# Patient Record
Sex: Male | Born: 1965 | Marital: Married | Smoking: Never smoker
Health system: Southern US, Community
[De-identification: ages and names within clinical notes are randomized; demographics above are authoritative.]

## PROBLEM LIST (undated history)

## (undated) DIAGNOSIS — E119 Type 2 diabetes mellitus without complications: Secondary | ICD-10-CM

## (undated) HISTORY — DX: Type 2 diabetes mellitus without complications: E11.9

---

## 2013-05-02 ENCOUNTER — Ambulatory Visit: Payer: Self-pay | Admitting: Family Medicine

## 2013-05-24 ENCOUNTER — Ambulatory Visit: Payer: Self-pay

## 2013-06-01 ENCOUNTER — Ambulatory Visit: Payer: Self-pay

## 2013-10-10 IMAGING — CT CT ABDOMEN AND PELVIS WITHOUT AND WITH CONTRAST
2 of 4 series · 14 of 32 positions shown, 19 images · non-contrast
Comparison: none

REASON FOR EXAM: HEMATURIA  DIABETIC NO MEDS
COMMENTS:

[Series 2: without · axial · non-contrast · 0.62mm/px · z∈[-950,-623]mm · 8 of 141 slices shown, 13 images]
[im 16/141  soft-tissue]
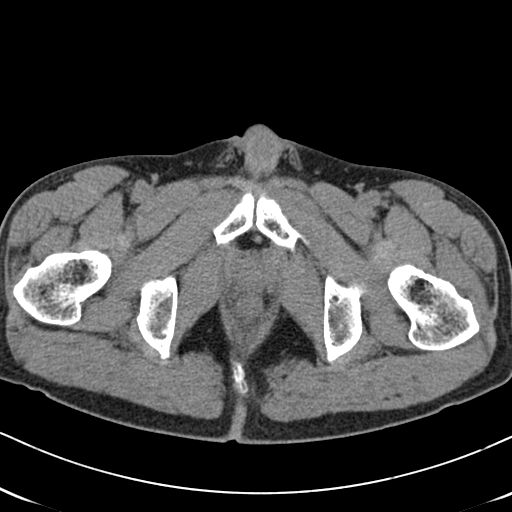
[im 16/141  bone]
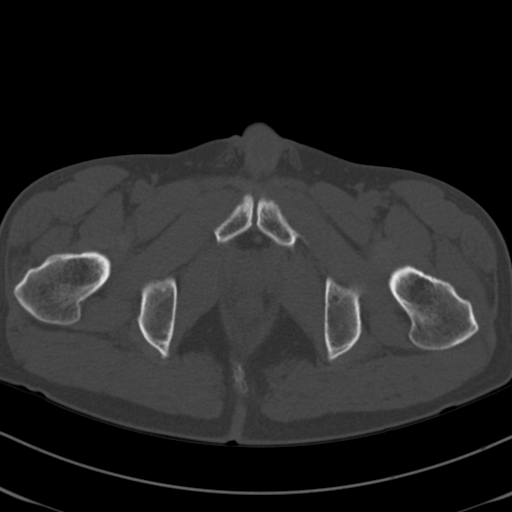
[im 32/141  soft-tissue]
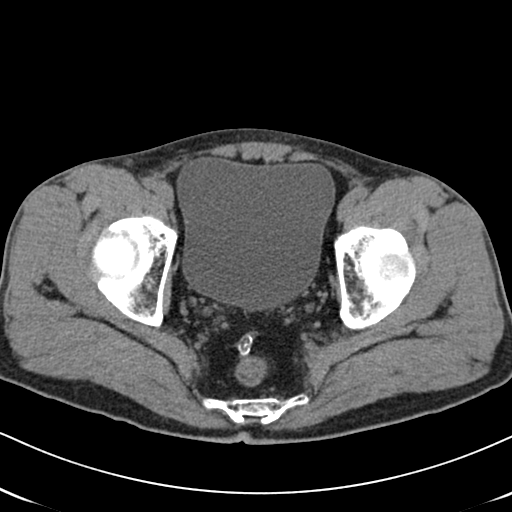
[im 47/141  soft-tissue]
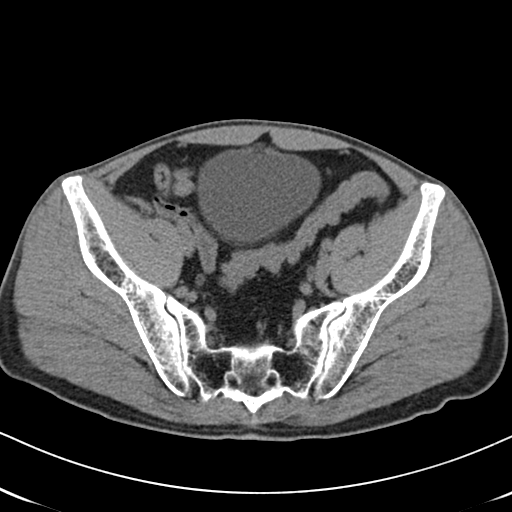
[im 63/141  soft-tissue]
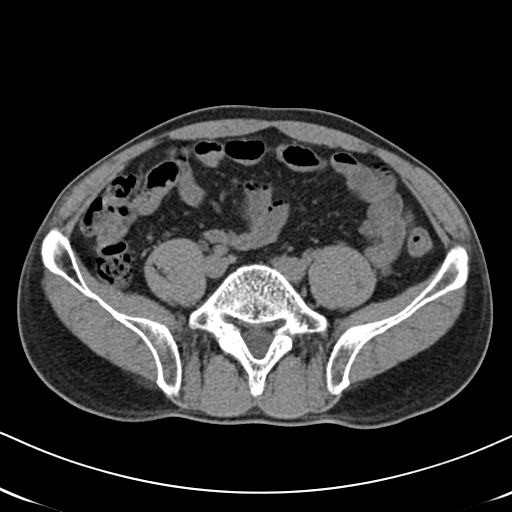
[im 78/141  soft-tissue]
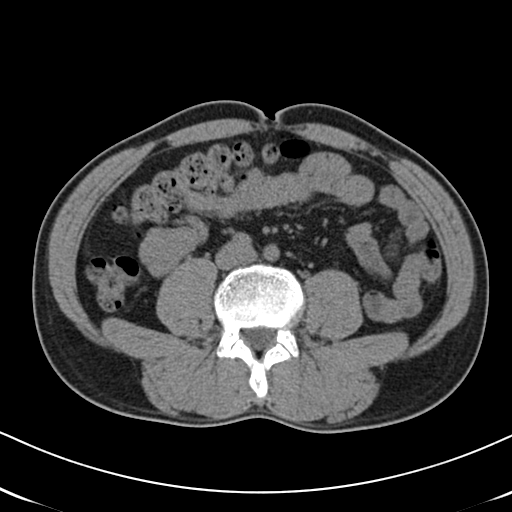
[im 78/141  lung]
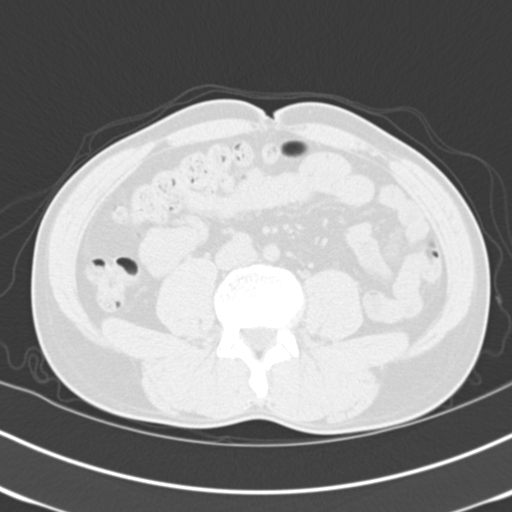
[im 94/141  soft-tissue]
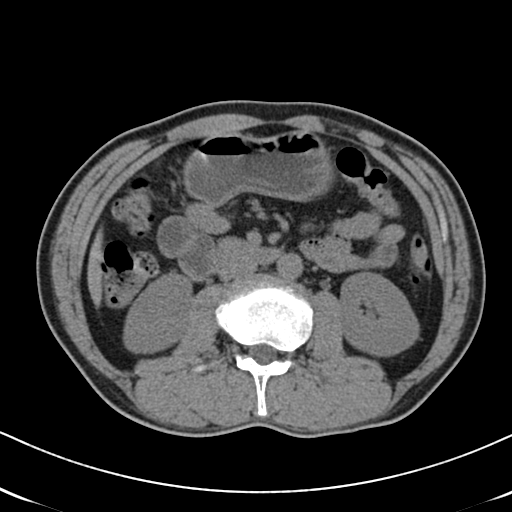
[im 94/141  lung]
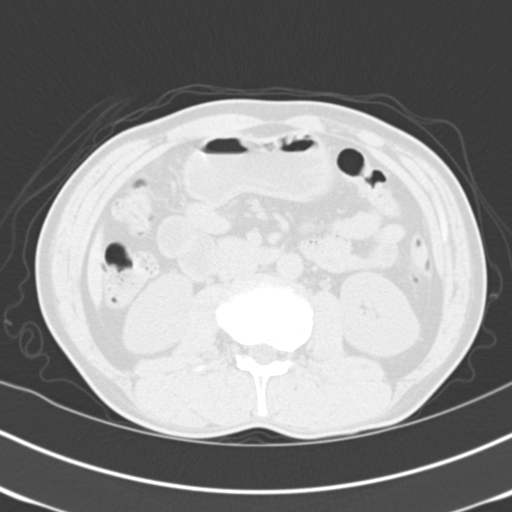
[im 109/141  soft-tissue]
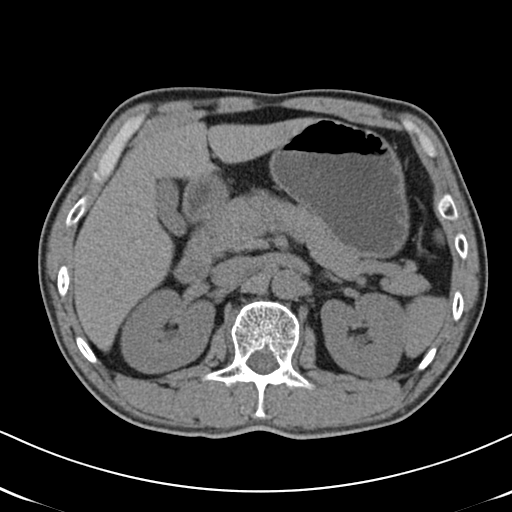
[im 109/141  lung]
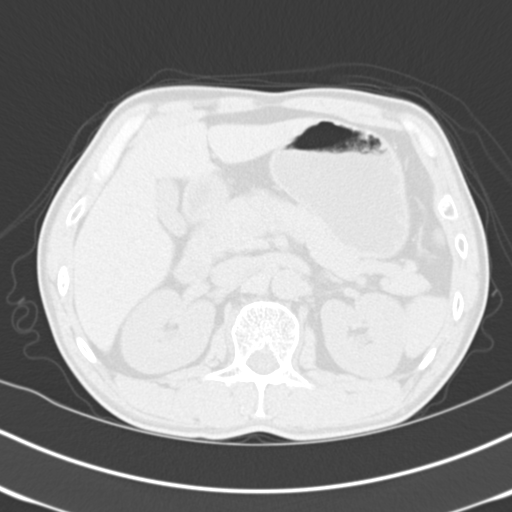
[im 125/141  soft-tissue]
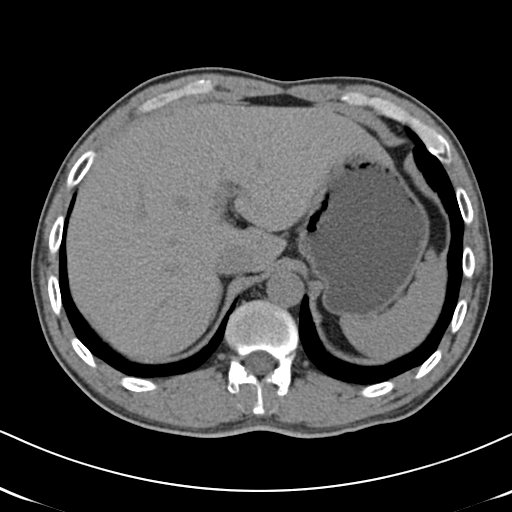
[im 125/141  lung]
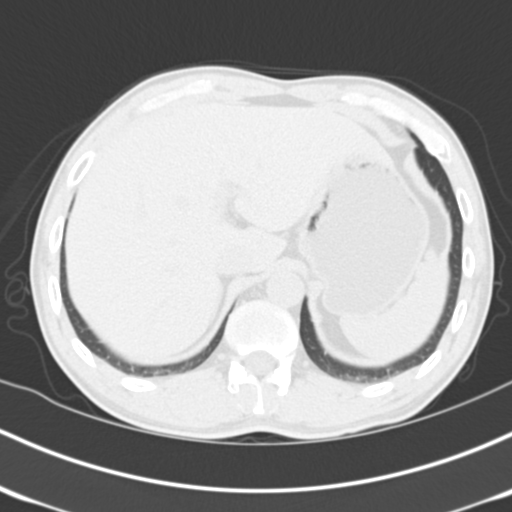

[Series 4: with · axial · 0.62mm/px · z∈[-950,-716]mm · 6 of 141 slices shown]
[im 16/141  soft-tissue]
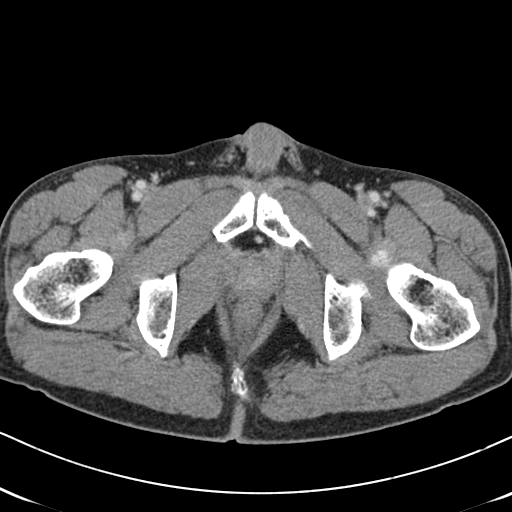
[im 32/141  soft-tissue]
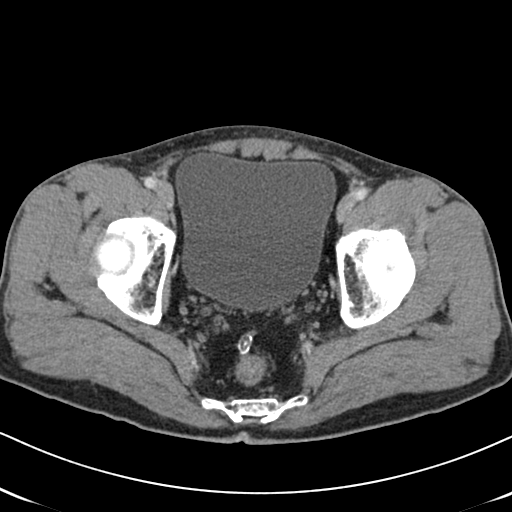
[im 47/141  soft-tissue]
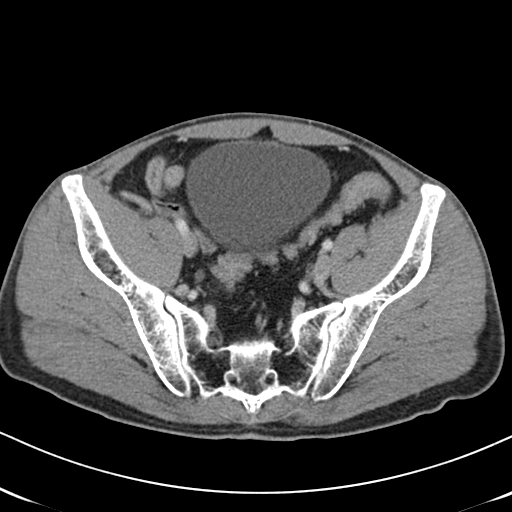
[im 63/141  soft-tissue]
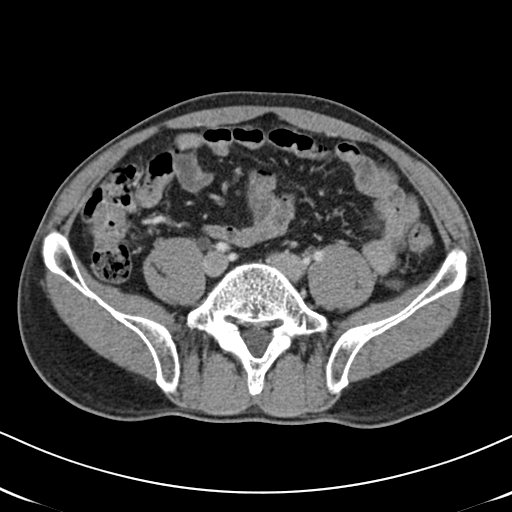
[im 78/141  soft-tissue]
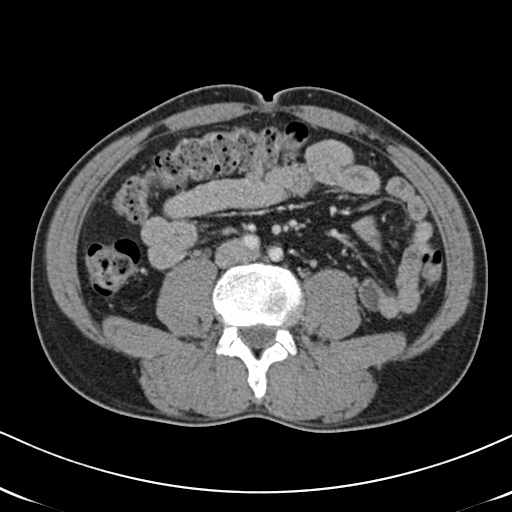
[im 94/141  soft-tissue]
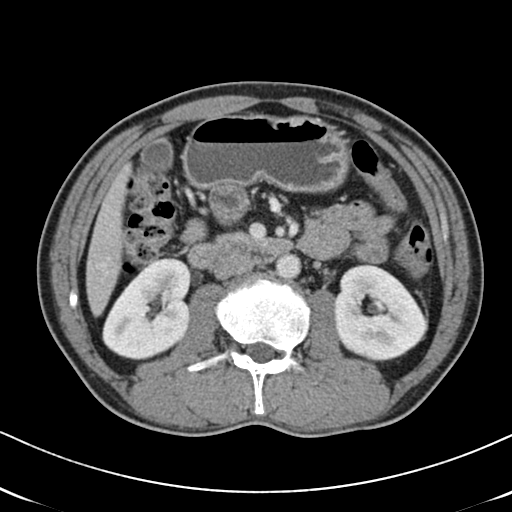

[14 of 32 positions shown; findings below may reference images not displayed]

PROCEDURE:     CT  - CT ABDOMEN / PELVIS  W/WO  - June 01, 2013  [DATE]

RESULT:     Triphasic CT of the abdomen and pelvis is reconstructed at 3 mm
slice thickness in the axial plane. The patient has no previous exam for
comparison.

Images through the base the lungs demonstrate minimal dependent atelectasis
with normal aeration otherwise. No pleural or pericardial effusion is
evident. No radiopaque gallstones are evident. There is no abnormal
calcification within the spleen or liver. There is no atherosclerotic
calcification. No nephrolithiasis, ureterolithiasis or bladder calculi are
demonstrated. A normal appearing appendix is present.

Following intravenous administration of 100 mL of Isovue-KU1 iodinated
intravenous contrast there is normal opacification of the kidneys. The aorta
is normal in caliber without dissection. The liver and spleen show no focal
mass or enlargement. The aorta shows normal opacification. The bony
structures are within normal limits. The post contrast delayed images
obtained in excretory phase show excretion of contrast opacified urine by
both kidneys with no filling defect in the bladder. There is a moderately
large amount urine in the bladder. No abnormal bowel distention is present.
IMPRESSION: No focal abnormality within either kidney. No focal bladder
abnormality evident. Unremarkable CT of the abdomen and pelvis.

[REDACTED]

## 2015-11-27 ENCOUNTER — Encounter: Payer: Self-pay | Admitting: Family Medicine

## 2015-11-27 ENCOUNTER — Ambulatory Visit (INDEPENDENT_AMBULATORY_CARE_PROVIDER_SITE_OTHER): Payer: BLUE CROSS/BLUE SHIELD | Admitting: Family Medicine

## 2015-11-27 VITALS — BP 104/68 | HR 59 | Temp 97.8°F | Resp 18 | Ht 68.0 in | Wt 143.6 lb

## 2015-11-27 DIAGNOSIS — R739 Hyperglycemia, unspecified: Secondary | ICD-10-CM | POA: Diagnosis not present

## 2015-11-27 LAB — POCT GLYCOSYLATED HEMOGLOBIN (HGB A1C): Hemoglobin A1C: 5.8

## 2015-11-27 LAB — GLUCOSE, POCT (MANUAL RESULT ENTRY): POC GLUCOSE: 117 mg/dL — AB (ref 70–99)

## 2015-11-27 NOTE — Progress Notes (Signed)
Name: Corey SearingBao Q Bhattacharyya   MRN: [id number]    DOB: 01/19/1966   Date:11/27/2015       Progress Note  Subjective  Chief Complaint  Chief Complaint  Patient presents with  . Diabetes    Diabetes Pertinent negatives for hypoglycemia include no dizziness, headaches, nervousness/anxiousness, seizures or tremors. Pertinent negatives for diabetes include no blurred vision, no chest pain, no weakness and no weight loss.    Hyperglycemia microalbumin on him At this is not truly diabetic S  Patient presents for follow-up of diabetes which is present for over 5 years. Is currently on a regimen of diet and exercise . Patient states recently compliant with their diet and exercise. There's been no hypoglycemic episodes and there no polyuria polydipsia polyphagia. His average fasting glucoses been in the low around - with a high around - . There is no end organ disease.  Last diabetic eye exam was many years ago.   Last visit with dietitian was -. Last microalbumin was obtained today .   Past Medical History  Diagnosis Date  . Diabetes mellitus without complication [facility](HCC)     Social History  Substance Use Topics  . Smoking status: Never Smoker   . Smokeless tobacco: Not on file  . Alcohol Use: 0.0 oz/week    0 Standard drinks or equivalent per week    No current outpatient prescriptions on file.  No Known Allergies  Review of Systems  Constitutional: Negative for fever, chills and weight loss.  HENT: Negative for congestion, hearing loss, sore throat and tinnitus.   Eyes: Negative for blurred vision, double vision and redness.  Respiratory: Negative for cough, hemoptysis and shortness of breath.   Cardiovascular: Negative for chest pain, palpitations, orthopnea, claudication and leg swelling.  Gastrointestinal: Negative for heartburn, nausea, vomiting, diarrhea, constipation and blood in stool.  Genitourinary: Negative for dysuria, urgency, frequency and hematuria.  Musculoskeletal: Negative for  myalgias, back pain, joint pain, falls and neck pain.  Skin: Negative for itching.  Neurological: Negative for dizziness, tingling, tremors, focal weakness, seizures, loss of consciousness, weakness and headaches.  Endo/Heme/Allergies: Does not bruise/bleed easily.  Psychiatric/Behavioral: Negative for depression and substance abuse. The patient is not nervous/anxious and does not have insomnia.      Objective  Filed Vitals:   11/27/15 0901  BP: 104/68  Pulse: 59  Temp: 97.8 F (36.6 C)  Resp: 18  Height: 5\' 8"  (1.727 m)  Weight: 143 lb 9 oz (65.12 kg)  SpO2: 98%     Physical Exam  Constitutional: He is oriented to person, place, and time and well-developed, well-nourished, and in no distress.  HENT:  Head: Normocephalic.  Eyes: EOM are normal. Pupils are equal, round, and reactive to light.  Neck: Normal range of motion. Neck supple. No thyromegaly present.  Cardiovascular: Normal rate, regular rhythm and normal heart sounds.   No murmur heard. Pulmonary/Chest: Effort normal and breath sounds normal. No respiratory distress. He has no wheezes.  Abdominal: Soft. Bowel sounds are normal.  Musculoskeletal: Normal range of motion. He exhibits no edema.  Lymphadenopathy:    He has no cervical adenopathy.  Neurological: He is alert and oriented to person, place, and time. No cranial nerve deficit. Gait normal. Coordination normal.  Skin: Skin is warm and dry. No rash noted.  Psychiatric: Affect and judgment normal.      Assessment & Plan  1. Hyperglycemia  - POCT HgB A1C - POCT Glucose (CBG)

## 2016-05-04 ENCOUNTER — Encounter: Payer: BLUE CROSS/BLUE SHIELD | Admitting: Family Medicine

## 2016-06-29 ENCOUNTER — Encounter: Payer: Self-pay | Admitting: Family Medicine

## 2016-06-29 ENCOUNTER — Encounter: Payer: BLUE CROSS/BLUE SHIELD | Admitting: Family Medicine

## 2016-06-29 DIAGNOSIS — Z8639 Personal history of other endocrine, nutritional and metabolic disease: Secondary | ICD-10-CM | POA: Insufficient documentation

## 2016-06-29 NOTE — Progress Notes (Signed)
Name: Corey Norman   MRN: [id number]    DOB: 03/27/1966   Date:06/29/2016       Progress Note  Subjective  Chief Complaint  Chief Complaint  Patient presents with  . Annual Exam    CPE    HPI  Pt. Presents for an Annual Physical Exam.   Past Medical History:  Diagnosis Date  . Diabetes mellitus without complication (HCC)     History reviewed. No pertinent surgical history.  Family History  Problem Relation Age of Onset  . Hypertension Mother     Social History   Social History  . Marital status: Married    Spouse name: N/A  . Number of children: N/A  . Years of education: N/A   Occupational History  . Not on file.   Social History Main Topics  . Smoking status: Never Smoker  . Smokeless tobacco: Never Used  . Alcohol use 0.0 oz/week  . Drug use: No  . Sexual activity: Not on file   Other Topics Concern  . Not on file   Social History Narrative  . No narrative on file    No current outpatient prescriptions on file.  No Known Allergies   ROS    Objective  Vitals:   06/29/16 1118  BP: 104/63  Pulse: 63  Resp: 15  Temp: 98.1 F (36.7 C)  TempSrc: Oral  SpO2: 98%  Weight: 139 lb 4.8 oz (63.2 kg)  Height: 5\' 8"  (1.727 m)    Physical Exam     No results found for this or any previous visit (from the past 2160 hour(s)).   Assessment & Plan  There are no diagnoses linked to this encounter.  Jeslyn Amsler Asad A. Faylene KurtzShah [facility] 06/29/2016 11:37 AM This encounter was created in error - please disregard.

## 2016-11-16 ENCOUNTER — Encounter: Payer: Self-pay | Admitting: Family Medicine

## 2016-11-16 ENCOUNTER — Ambulatory Visit (INDEPENDENT_AMBULATORY_CARE_PROVIDER_SITE_OTHER): Payer: BLUE CROSS/BLUE SHIELD | Admitting: Family Medicine

## 2016-11-16 VITALS — BP 110/68 | HR 69 | Temp 97.2°F | Resp 16 | Ht 68.0 in | Wt 145.5 lb

## 2016-11-16 DIAGNOSIS — Z7689 Persons encountering health services in other specified circumstances: Secondary | ICD-10-CM

## 2016-11-16 DIAGNOSIS — Z8639 Personal history of other endocrine, nutritional and metabolic disease: Secondary | ICD-10-CM

## 2016-11-16 DIAGNOSIS — Z0000 Encounter for general adult medical examination without abnormal findings: Secondary | ICD-10-CM | POA: Insufficient documentation

## 2016-11-16 LAB — GLUCOSE, POCT (MANUAL RESULT ENTRY): POC GLUCOSE: 140 mg/dL — AB (ref 70–99)

## 2016-11-16 LAB — POCT GLYCOSYLATED HEMOGLOBIN (HGB A1C): HEMOGLOBIN A1C: 5.9

## 2016-11-16 NOTE — Progress Notes (Signed)
Name: Robin SearingBao Q Demos   MRN: [id number]    DOB: 09/26/1966   Date:11/16/2016       Progress Note  Subjective  Chief Complaint  Chief Complaint  Patient presents with  . Annual Exam    Diabetes  He presents for his follow-up diabetic visit. He has type 2 diabetes mellitus. The initial diagnosis of diabetes was made 10 years ago. His disease course has been stable. There are no hypoglycemic associated symptoms. Pertinent negatives for diabetes include no chest pain, no fatigue, no foot paresthesias, no polydipsia and no polyuria. Current diabetic treatment includes diet (in the past, he has been on medication for diabetes, but none at this time, last A1c was within normal range). His weight is stable. Home blood sugar record trend: pt. does not check his blood glucose. An ACE inhibitor/angiotensin II receptor blocker is not being taken.     Past Medical History:  Diagnosis Date  . Diabetes mellitus without complication (HCC)    Last A1c was normal at 5.8%, not on medication.    No past surgical history on file.  Family History  Problem Relation Age of Onset  . Hypertension Mother     Social History   Social History  . Marital status: Married    Spouse name: N/A  . Number of children: N/A  . Years of education: N/A   Occupational History  . Not on file.   Social History Main Topics  . Smoking status: Never Smoker  . Smokeless tobacco: Never Used  . Alcohol use 0.0 oz/week  . Drug use: No  . Sexual activity: Not on file   Other Topics Concern  . Not on file   Social History Narrative  . No narrative on file    No current outpatient prescriptions on file.  No Known Allergies   Review of Systems  Constitutional: Negative for fatigue.  Cardiovascular: Negative for chest pain.  Endo/Heme/Allergies: Negative for polydipsia.     Objective  Vitals:   11/16/16 1347  BP: 110/68  Pulse: 69  Resp: 16  Temp: 97.2 F (36.2 C)  TempSrc: Oral  SpO2: 98%  Weight: 145  lb 8 oz (66 kg)  Height: 5\' 8"  (1.727 m)    Physical Exam  Constitutional: He is oriented to person, place, and time and well-developed, well-nourished, and in no distress.  HENT:  Head: Normocephalic and atraumatic.  Cardiovascular: Normal rate, regular rhythm and normal heart sounds.   No murmur heard. Pulmonary/Chest: Effort normal and breath sounds normal. He has no wheezes.  Neurological: He is alert and oriented to person, place, and time.  Psychiatric: Mood, memory, affect and judgment normal.  Nursing note and vitals reviewed.     Assessment & Plan  1. History of diabetes mellitus, type II Point-of-care A1c is 5.9%, considered prediabetes, glucose is elevated at 14 0 mg/dL. No pharmacotherapy indicated at this time. - POCT HgB A1C - POCT Glucose (CBG)  2. Encounter to establish care with new doctor Reviewed previous office visit Dr.Morrisey,updated past medical, surgical and social history. Patient is on no medications. Follow-up for a complete physical exam  Rubie Ficco Asad A. Faylene KurtzShah [facility] 11/16/2016 2:42 PM

## 2016-11-19 ENCOUNTER — Ambulatory Visit (INDEPENDENT_AMBULATORY_CARE_PROVIDER_SITE_OTHER): Payer: BLUE CROSS/BLUE SHIELD | Admitting: Family Medicine

## 2016-11-19 ENCOUNTER — Encounter: Payer: Self-pay | Admitting: Family Medicine

## 2016-11-19 VITALS — BP 112/80 | HR 63 | Temp 98.7°F | Resp 16 | Ht 68.0 in | Wt 144.2 lb

## 2016-11-19 DIAGNOSIS — Z1211 Encounter for screening for malignant neoplasm of colon: Secondary | ICD-10-CM | POA: Diagnosis not present

## 2016-11-19 DIAGNOSIS — Z0000 Encounter for general adult medical examination without abnormal findings: Secondary | ICD-10-CM | POA: Diagnosis not present

## 2016-11-19 LAB — CBC WITH DIFFERENTIAL/PLATELET
BASOS ABS: 52 {cells}/uL (ref 0–200)
Basophils Relative: 1 %
Eosinophils Absolute: 52 cells/uL (ref 15–500)
Eosinophils Relative: 1 %
HCT: 48.6 % (ref 38.5–50.0)
Hemoglobin: 16.4 g/dL (ref 13.2–17.1)
Lymphocytes Relative: 39 %
Lymphs Abs: 2028 cells/uL (ref 850–3900)
MCH: 32.2 pg (ref 27.0–33.0)
MCHC: 33.7 g/dL (ref 32.0–36.0)
MCV: 95.3 fL (ref 80.0–100.0)
MONOS PCT: 8 %
MPV: 9.5 fL (ref 7.5–12.5)
Monocytes Absolute: 416 cells/uL (ref 200–950)
NEUTROS ABS: 2652 {cells}/uL (ref 1500–7800)
NEUTROS PCT: 51 %
PLATELETS: 228 10*3/uL (ref 140–400)
RBC: 5.1 MIL/uL (ref 4.20–5.80)
RDW: 13.2 % (ref 11.0–15.0)
WBC: 5.2 10*3/uL (ref 3.8–10.8)

## 2016-11-19 LAB — COMPLETE METABOLIC PANEL WITH GFR
ALT: 28 U/L (ref 9–46)
AST: 25 U/L (ref 10–35)
Albumin: 4.8 g/dL (ref 3.6–5.1)
Alkaline Phosphatase: 68 U/L (ref 40–115)
BUN: 16 mg/dL (ref 7–25)
CHLORIDE: 104 mmol/L (ref 98–110)
CO2: 28 mmol/L (ref 20–31)
Calcium: 9.7 mg/dL (ref 8.6–10.3)
Creat: 0.79 mg/dL (ref 0.70–1.33)
GFR, Est African American: >89 mL/min (ref >=60)
GLUCOSE: 132 mg/dL — AB (ref 65–99)
POTASSIUM: 4.9 mmol/L (ref 3.5–5.3)
Sodium: 141 mmol/L (ref 135–146)
Total Bilirubin: 1 mg/dL (ref 0.2–1.2)
Total Protein: 7.4 g/dL (ref 6.1–8.1)

## 2016-11-19 LAB — LIPID PANEL
CHOL/HDL RATIO: 2.6 ratio (ref <5.0)
Cholesterol: 223 mg/dL — ABNORMAL HIGH (ref <200)
HDL: 86 mg/dL (ref >40)
LDL CALC: 118 mg/dL — AB (ref <100)
Triglycerides: 97 mg/dL (ref <150)
VLDL: 19 mg/dL (ref <30)

## 2016-11-19 LAB — TSH: TSH: 2.81 m[IU]/L (ref 0.40–4.50)

## 2016-11-19 NOTE — Progress Notes (Signed)
Name: Corey Norman   MRN: [id number]    DOB: 07/03/1966   Date:11/19/2016       Progress Note  Subjective  Chief Complaint  Chief Complaint  Patient presents with  . Annual Exam    HPI  Pt. Presents for CPE, he is doing well. Never had a colonoscopy, prostate exam was 2-3 years ago.  Past Medical History:  Diagnosis Date  . Diabetes mellitus without complication (HCC)    Last A1c was normal at 5.8%, not on medication.    History reviewed. No pertinent surgical history.  Family History  Problem Relation Age of Onset  . Hypertension Mother     Social History   Social History  . Marital status: Married    Spouse name: N/A  . Number of children: N/A  . Years of education: N/A   Occupational History  . Not on file.   Social History Main Topics  . Smoking status: Never Smoker  . Smokeless tobacco: Never Used  . Alcohol use 0.6 oz/week    1 Glasses of wine per week  . Drug use: No  . Sexual activity: Yes   Other Topics Concern  . Not on file   Social History Narrative  . No narrative on file    No current outpatient prescriptions on file.  No Known Allergies   Review of Systems  Constitutional: Negative for chills, fever and malaise/fatigue.  HENT: Negative for ear pain, sinus pain and sore throat.   Eyes: Negative for blurred vision and double vision.  Respiratory: Negative for cough and shortness of breath.   Cardiovascular: Negative for chest pain and leg swelling.  Gastrointestinal: Negative for abdominal pain, blood in stool, constipation, diarrhea, nausea and vomiting.  Genitourinary: Negative for dysuria, frequency, hematuria and urgency.  Musculoskeletal: Negative for back pain, joint pain and myalgias.  Neurological: Negative for dizziness and headaches.  Psychiatric/Behavioral: Negative for depression. The patient is not nervous/anxious and does not have insomnia.      Objective  Vitals:   11/19/16 0805  BP: 112/80  Pulse: 63  Resp: 16   Temp: 98.7 F (37.1 C)  TempSrc: Oral  SpO2: 98%  Weight: 144 lb 3.2 oz (65.4 kg)  Height: 5\' 8"  (1.727 m)    Physical Exam  Constitutional: He is oriented to person, place, and time and well-developed, well-nourished, and in no distress.  HENT:  Head: Normocephalic and atraumatic.  Right Ear: Tympanic membrane and ear canal normal.  Left Ear: Tympanic membrane and ear canal normal.  Eyes: Pupils are equal, round, and reactive to light.  Neck: Normal range of motion. Neck supple.  Cardiovascular: Normal rate, regular rhythm and normal heart sounds.   No murmur heard. Pulmonary/Chest: Effort normal and breath sounds normal. No respiratory distress. He has no wheezes. He has no rhonchi.  Abdominal: Soft. Bowel sounds are normal. There is no tenderness.  Genitourinary: Prostate normal. Rectal exam shows external hemorrhoid. Prostate is not enlarged and not tender.  Musculoskeletal:       Right ankle: He exhibits no swelling.       Left ankle: He exhibits no swelling.  Neurological: He is alert and oriented to person, place, and time.  Psychiatric: Mood, memory, affect and judgment normal.  Nursing note and vitals reviewed.    Assessment & Plan  1. Annual physical exam Obtain age appropriate laboratory screenings, EKG is normal - CBC with Differential - COMPLETE METABOLIC PANEL WITH GFR - Lipid Profile - TSH - Vitamin D (  25 hydroxy) - PSA - EKG 12-Lead  2. Colon cancer screening  - Ambulatory referral to Gastroenterology  Interpreter ID#: [id number]  Hazel SamsSyed Asad A. Faylene KurtzShah [facility] 11/19/2016 3:39 PM

## 2016-11-20 LAB — VITAMIN D 25 HYDROXY (VIT D DEFICIENCY, FRACTURES): Vit D, 25-Hydroxy: 28 ng/mL — ABNORMAL LOW (ref 30–100)

## 2016-11-20 LAB — PSA: PSA: 0.8 ng/mL (ref <=4.0)

## 2016-12-01 ENCOUNTER — Telehealth: Payer: Self-pay

## 2016-12-01 MED ORDER — VITAMIN D (ERGOCALCIFEROL) 1.25 MG (50000 UNIT) PO CAPS: 50000.0000 [IU] | ORAL_CAPSULE | ORAL | 0 refills | Status: DC

## 2016-12-01 NOTE — Telephone Encounter (Signed)
Patient has been notified of lab results by letter mailed to address on file due to patient does not speak english A prescription for vitamin D3 50,000 units take 1 capsule once a week for 12 weeks has been sent to [facility] [address] per Dr. Sherryll BurgerShah

## 2017-03-03 ENCOUNTER — Other Ambulatory Visit: Payer: Self-pay | Admitting: Family Medicine

## 2017-05-24 ENCOUNTER — Ambulatory Visit: Payer: BLUE CROSS/BLUE SHIELD | Admitting: Family Medicine

## 2017-06-07 ENCOUNTER — Ambulatory Visit (INDEPENDENT_AMBULATORY_CARE_PROVIDER_SITE_OTHER): Payer: BLUE CROSS/BLUE SHIELD | Admitting: Family Medicine

## 2017-06-07 ENCOUNTER — Encounter: Payer: Self-pay | Admitting: Family Medicine

## 2017-06-07 VITALS — BP 114/75 | HR 67 | Temp 98.0°F | Resp 16 | Ht 68.0 in | Wt 142.8 lb

## 2017-06-07 DIAGNOSIS — Z8639 Personal history of other endocrine, nutritional and metabolic disease: Secondary | ICD-10-CM | POA: Insufficient documentation

## 2017-06-07 DIAGNOSIS — E7800 Pure hypercholesterolemia, unspecified: Secondary | ICD-10-CM | POA: Diagnosis not present

## 2017-06-07 DIAGNOSIS — E559 Vitamin D deficiency, unspecified: Secondary | ICD-10-CM | POA: Insufficient documentation

## 2017-06-07 DIAGNOSIS — E119 Type 2 diabetes mellitus without complications: Secondary | ICD-10-CM

## 2017-06-07 LAB — POCT GLYCOSYLATED HEMOGLOBIN (HGB A1C): Hemoglobin A1C: 6.2

## 2017-06-07 LAB — GLUCOSE, POCT (MANUAL RESULT ENTRY): POC GLUCOSE: 95 mg/dL (ref 70–99)

## 2017-06-07 NOTE — Progress Notes (Signed)
Name: Corey Norman   MRN: [id number]    DOB: 03/13/1966   Date:06/07/2017       Progress Note  Subjective  Chief Complaint  Chief Complaint  Patient presents with  . Follow-up    DM    Diabetes  He presents for his follow-up diabetic visit. He has type 2 diabetes mellitus. His disease course has been stable. There are no hypoglycemic associated symptoms. Pertinent negatives for diabetes include no fatigue, no foot paresthesias, no polydipsia and no polyuria. Pertinent negatives for diabetic complications include no CVA or heart disease. Current diabetic treatment includes diet. His weight is stable. He is following a generally healthy diet. He participates in exercise daily.  Hyperlipidemia  This is a chronic problem. The problem is uncontrolled. Recent lipid tests were reviewed and are high. Current antihyperlipidemic treatment includes diet change. There are no compliance problems.       Past Medical History:  Diagnosis Date  . Diabetes mellitus without complication (HCC)    Last A1c was normal at 5.8%, not on medication.    History reviewed. No pertinent surgical history.  Family History  Problem Relation Age of Onset  . Hypertension Mother     Social History   Social History  . Marital status: Married    Spouse name: N/A  . Number of children: N/A  . Years of education: N/A   Occupational History  . Not on file.   Social History Main Topics  . Smoking status: Never Smoker  . Smokeless tobacco: Never Used  . Alcohol use 0.6 oz/week    1 Glasses of wine per week  . Drug use: No  . Sexual activity: Yes   Other Topics Concern  . Not on file   Social History Narrative  . No narrative on file     Current Outpatient Prescriptions:  Marland Kitchen.  Vitamin D, Ergocalciferol, (DRISDOL) 50000 units CAPS capsule, Take 1 capsule (50,000 Units total) by mouth once a week. For 12 weeks (Patient not taking: Reported on 06/07/2017), Disp: 12 capsule, Rfl: 0  No Known  Allergies   Review of Systems  Constitutional: Negative for fatigue.  Endo/Heme/Allergies: Negative for polydipsia.      Objective  Vitals:   06/07/17 1520  BP: 114/75  Pulse: 67  Resp: 16  Temp: 98 F (36.7 C)  TempSrc: Oral  SpO2: 98%  Weight: 142 lb 12.8 oz (64.8 kg)  Height: 5\' 8"  (1.727 m)    Physical Exam  Constitutional: He is oriented to person, place, and time and well-developed, well-nourished, and in no distress.  HENT:  Head: Normocephalic and atraumatic.  Cardiovascular: Normal rate, regular rhythm and normal heart sounds.   No murmur heard. Pulmonary/Chest: Effort normal. He has no wheezes.  Abdominal: Soft. Bowel sounds are normal. There is no tenderness.  Musculoskeletal: Normal range of motion. He exhibits no edema.  Neurological: He is alert and oriented to person, place, and time.  Skin: Skin is warm and dry.  Psychiatric: Mood, memory, affect and judgment normal.  Nursing note and vitals reviewed.    Recent Results (from the past 2160 hour(s))  POCT Glucose (CBG)     Status: Normal   Collection Time: 06/07/17  3:24 PM  Result Value Ref Range   POC Glucose 95 70 - 99 mg/dl  POCT HgB [id number]     Status: Abnormal   Collection Time: 06/07/17  3:28 PM  Result Value Ref Range   Hemoglobin A1C 6.2  Assessment & Plan  1. Diabetes mellitus without complication (HCC)  A1c 6.2%, well-controlled diabetes, no pharmacotherapy indicated, obtain urine microalbumin- POCT HgB A1C - POCT Glucose (CBG) - Urine Microalbumin w/creat. ratio  2. Hypercholesterolemia  elevated total and LDL cholesterol, repeat today and consider starting on statin - Lipid panel  3. Vitamin D insufficiency  - VITAMIN D 25 Hydroxy (Vit-D Deficiency, Fractures)  Latavious Bitter Asad A. Faylene [facility] 06/07/2017 3:31 PM

## 2017-06-08 LAB — LIPID PANEL
Cholesterol: 158 mg/dL (ref <200)
HDL: 53 mg/dL (ref >40)
LDL Cholesterol: 72 mg/dL (ref <100)
Total CHOL/HDL Ratio: 3 Ratio (ref <5.0)
Triglycerides: 163 mg/dL — ABNORMAL HIGH (ref <150)
VLDL: 33 mg/dL — ABNORMAL HIGH (ref <30)

## 2017-06-09 LAB — MICROALBUMIN / CREATININE URINE RATIO
Creatinine, Urine: 147 mg/dL (ref 20–370)
Microalb, Ur: <0.2 mg/dL

## 2017-06-09 LAB — VITAMIN D 25 HYDROXY (VIT D DEFICIENCY, FRACTURES): VIT D 25 HYDROXY: 27 ng/mL — AB (ref 30–100)

## 2017-06-10 ENCOUNTER — Telehealth: Payer: Self-pay

## 2017-06-10 MED ORDER — VITAMIN D (ERGOCALCIFEROL) 1.25 MG (50000 UNIT) PO CAPS: 50000.0000 [IU] | ORAL_CAPSULE | ORAL | 0 refills | Status: DC

## 2017-06-10 NOTE — Telephone Encounter (Signed)
Patient has been notified of lab results and a prescription for Vitamin D3 50,000 units take 1 capsule once a week x12 weeks has been sent to [facility] [address] per Dr. Sherryll BurgerShah, patient has been notified

## 2017-09-13 ENCOUNTER — Ambulatory Visit: Payer: BLUE CROSS/BLUE SHIELD | Admitting: Family Medicine

## 2017-10-06 ENCOUNTER — Other Ambulatory Visit: Payer: Self-pay | Admitting: Family Medicine

## 2017-11-29 ENCOUNTER — Encounter: Payer: Self-pay | Admitting: Family Medicine

## 2017-11-29 ENCOUNTER — Ambulatory Visit (INDEPENDENT_AMBULATORY_CARE_PROVIDER_SITE_OTHER): Payer: BLUE CROSS/BLUE SHIELD | Admitting: Family Medicine

## 2017-11-29 VITALS — BP 110/64 | HR 67 | Temp 98.1°F | Resp 14 | Ht 68.0 in | Wt 144.1 lb

## 2017-11-29 DIAGNOSIS — K2970 Gastritis, unspecified, without bleeding: Secondary | ICD-10-CM

## 2017-11-29 DIAGNOSIS — B9681 Helicobacter pylori [H. pylori] as the cause of diseases classified elsewhere: Secondary | ICD-10-CM

## 2017-11-29 DIAGNOSIS — Z0001 Encounter for general adult medical examination with abnormal findings: Secondary | ICD-10-CM | POA: Diagnosis not present

## 2017-11-29 DIAGNOSIS — E119 Type 2 diabetes mellitus without complications: Secondary | ICD-10-CM | POA: Diagnosis not present

## 2017-11-29 DIAGNOSIS — Z0000 Encounter for general adult medical examination without abnormal findings: Secondary | ICD-10-CM

## 2017-11-29 LAB — POCT GLYCOSYLATED HEMOGLOBIN (HGB A1C): Hemoglobin A1C: 6.2

## 2017-11-29 LAB — GLUCOSE, POCT (MANUAL RESULT ENTRY): POC Glucose: 136 mg/dl — AB (ref 70–99)

## 2017-11-29 NOTE — Progress Notes (Signed)
Name: Corey Norman   MRN: [id number]    DOB: 01/30/1966   Date:11/29/2017       Progress Note  Subjective  Chief Complaint  Chief Complaint  Patient presents with  . Annual Exam    HPI  Pt. Presents for Annual Physical Exam He [place], showed adenomatous polyps and biopsy report is not available.  He is due for prostate cancer screening.    Past Medical History:  Diagnosis Date  . Diabetes mellitus without complication (HCC)    Last A1c was normal at 5.8%, not on medication.    History reviewed. No pertinent surgical history.  Family History  Problem Relation Age of Onset  . Hypertension Mother     Social History   Socioeconomic History  . Marital status: Married    Spouse name: Not on file  . Number of children: Not on file  . Years of education: Not on file  . Highest education level: Not on file  Social Needs  . Financial resource strain: Not on file  . Food insecurity - worry: Not on file  . Food insecurity - inability: Not on file  . Transportation needs - medical: Not on file  . Transportation needs - non-medical: Not on file  Occupational History  . Not on file  Tobacco Use  . Smoking status: Never Smoker  . Smokeless tobacco: Never Used  Substance and Sexual Activity  . Alcohol use: Yes    Alcohol/week: 0.6 oz    Types: 1 Glasses of wine per week  . Drug use: No  . Sexual activity: Yes  Other Topics Concern  . Not on file  Social History Narrative  . Not on file     Current Outpatient Medications:  .  cholecalciferol (VITAMIN D) 1000 units tablet, Take 1,000 Units by mouth daily., Disp: , Rfl:  .  Vitamin D, Ergocalciferol, (DRISDOL) 50000 units CAPS capsule, Take 1 capsule (50,000 Units total) by mouth once a week. For 12 weeks (Patient not taking: Reported on 11/29/2017), Disp: 12 capsule, Rfl: 0  No Known Allergies   Review of Systems  Constitutional: Negative for chills, fever, malaise/fatigue and weight  loss.  HENT: Negative for congestion, ear pain and sinus pain.   Eyes: Negative for blurred vision and double vision.  Respiratory: Negative for cough, hemoptysis, sputum production and stridor.   Cardiovascular: Negative for chest pain, palpitations and claudication.  Gastrointestinal: Negative for abdominal pain, blood in stool, nausea and vomiting.  Genitourinary: Negative for dysuria and hematuria.  Musculoskeletal: Negative for back pain and neck pain.  Skin: Negative for itching and rash.  Neurological: Negative for dizziness and headaches.  Psychiatric/Behavioral: Negative for depression. The patient is not nervous/anxious and does not have insomnia.      Objective  Vitals:   11/29/17 1502  BP: 110/64  Pulse: 67  Resp: 14  Temp: 98.1 F (36.7 C)  TempSrc: Oral  SpO2: 99%  Weight: 144 lb 1.6 oz (65.4 kg)  Height: 5\' 8"  (1.727 m)    Physical Exam  Constitutional: He is oriented to person, place, and time and well-developed, well-nourished, and in no distress.  HENT:  Head: Normocephalic and atraumatic.  Mouth/Throat: Uvula is midline and oropharynx is clear and moist.  Moderate amount of cerumen in the ear canals  Cardiovascular: Normal rate, regular rhythm, S1 normal, S2 normal and normal heart sounds.  No murmur heard. Pulmonary/Chest: Effort normal and breath sounds normal. No respiratory distress.  He has no wheezes. He has no rhonchi.  Abdominal: Soft. Bowel sounds are normal. There is no tenderness.  Genitourinary: Prostate normal. Rectal exam shows external hemorrhoid. Rectal exam shows no tenderness.  Neurological: He is alert and oriented to person, place, and time. GCS score is 15.  Psychiatric: Mood, memory, affect and judgment normal.  Nursing note and vitals reviewed.      Assessment & Plan  1. Annual physical exam Obtain age-appropriate laboratory screenings - CBC with Differential/Platelet - COMPLETE METABOLIC PANEL WITH GFR - Lipid panel -  VITAMIN D 25 Hydroxy (Vit-D Deficiency, Fractures) - TSH - PSA  2. Diabetes mellitus without complication (HCC) Point-of-care A1c 6.2%, well-controlled diabetes - POCT HgB A1C - POCT Glucose (CBG)  3. Helicobacter pylori gastritis Patient has apparently been treated for H. pylori gastritis by the gastroenterologist in [place], we will order an H. pylori breath test to confirm eradication as recommended by the gastroenterologist - H. pylori breath test  Hazel Sams A. Faylene [facility] 11/29/2017 3:12 PM

## 2017-12-08 LAB — COMPREHENSIVE METABOLIC PANEL
ALK PHOS: 80 IU/L (ref 39–117)
ALT: 24 IU/L (ref 0–44)
AST: 19 IU/L (ref 0–40)
Albumin/Globulin Ratio: 1.8 (ref 1.2–2.2)
Albumin: 4.6 g/dL (ref 3.5–5.5)
BUN/Creatinine Ratio: 22 — ABNORMAL HIGH (ref 9–20)
BUN: 16 mg/dL (ref 6–24)
Bilirubin Total: 0.6 mg/dL (ref 0.0–1.2)
CO2: 23 mmol/L (ref 20–29)
CREATININE: 0.74 mg/dL — AB (ref 0.76–1.27)
Calcium: 9.3 mg/dL (ref 8.7–10.2)
Chloride: 104 mmol/L (ref 96–106)
GFR calc Af Amer: 123 mL/min/{1.73_m2} (ref >59)
GFR calc non Af Amer: 106 mL/min/{1.73_m2} (ref >59)
Globulin, Total: 2.6 g/dL (ref 1.5–4.5)
Glucose: 117 mg/dL — ABNORMAL HIGH (ref 65–99)
Potassium: 4.1 mmol/L (ref 3.5–5.2)
SODIUM: 144 mmol/L (ref 134–144)
Total Protein: 7.2 g/dL (ref 6.0–8.5)

## 2017-12-08 LAB — CBC WITH DIFFERENTIAL/PLATELET
Basophils Absolute: 0 10*3/uL (ref 0.0–0.2)
Basos: 1 %
EOS (ABSOLUTE): 0.1 10*3/uL (ref 0.0–0.4)
Eos: 2 %
Hematocrit: 47.1 % (ref 37.5–51.0)
Hemoglobin: 15.4 g/dL (ref 13.0–17.7)
Immature Grans (Abs): 0 10*3/uL (ref 0.0–0.1)
Immature Granulocytes: 0 %
LYMPHS ABS: 2 10*3/uL (ref 0.7–3.1)
Lymphs: 49 %
MCH: 30.9 pg (ref 26.6–33.0)
MCHC: 32.7 g/dL (ref 31.5–35.7)
MCV: 94 fL (ref 79–97)
MONOCYTES: 7 %
Monocytes Absolute: 0.3 10*3/uL (ref 0.1–0.9)
Neutrophils Absolute: 1.6 10*3/uL (ref 1.4–7.0)
Neutrophils: 41 %
Platelets: 218 10*3/uL (ref 150–379)
RBC: 4.99 x10E6/uL (ref 4.14–5.80)
RDW: 13.9 % (ref 12.3–15.4)
WBC: 3.9 10*3/uL (ref 3.4–10.8)

## 2017-12-08 LAB — LIPID PANEL
CHOL/HDL RATIO: 2.5 ratio (ref 0.0–5.0)
CHOLESTEROL TOTAL: 181 mg/dL (ref 100–199)
HDL: 72 mg/dL (ref >39)
LDL Calculated: 90 mg/dL (ref 0–99)
TRIGLYCERIDES: 94 mg/dL (ref 0–149)
VLDL Cholesterol Cal: 19 mg/dL (ref 5–40)

## 2017-12-08 LAB — H. PYLORI BREATH TEST: H pylori Breath Test: NEGATIVE

## 2017-12-08 LAB — TSH: TSH: 1.77 u[IU]/mL (ref 0.450–4.500)

## 2017-12-08 LAB — VITAMIN D 25 HYDROXY (VIT D DEFICIENCY, FRACTURES): VIT D 25 HYDROXY: 33.9 ng/mL (ref 30.0–100.0)

## 2017-12-08 LAB — PSA: Prostate Specific Ag, Serum: 0.8 ng/mL (ref 0.0–4.0)

## 2018-03-07 ENCOUNTER — Ambulatory Visit (INDEPENDENT_AMBULATORY_CARE_PROVIDER_SITE_OTHER): Payer: BLUE CROSS/BLUE SHIELD | Admitting: Family Medicine

## 2018-03-07 ENCOUNTER — Encounter: Payer: Self-pay | Admitting: Family Medicine

## 2018-03-07 VITALS — BP 120/70 | HR 64 | Temp 98.0°F | Resp 16 | Ht 68.0 in | Wt 139.2 lb

## 2018-03-07 DIAGNOSIS — Z8639 Personal history of other endocrine, nutritional and metabolic disease: Secondary | ICD-10-CM | POA: Diagnosis not present

## 2018-03-07 DIAGNOSIS — E559 Vitamin D deficiency, unspecified: Secondary | ICD-10-CM

## 2018-03-07 DIAGNOSIS — B351 Tinea unguium: Secondary | ICD-10-CM | POA: Diagnosis not present

## 2018-03-07 DIAGNOSIS — E7800 Pure hypercholesterolemia, unspecified: Secondary | ICD-10-CM

## 2018-03-07 MED ORDER — CYANOCOBALAMIN 2000 MCG PO TABS: 2000.0000 ug | ORAL_TABLET | ORAL | Status: DC

## 2018-03-07 NOTE — Patient Instructions (Addendum)
You can try tolnaftate on the rough scaly skin on your feet We'll see you in July for a visit and then try to get you on track for seeing you twice a year

## 2018-03-07 NOTE — Assessment & Plan Note (Addendum)
Check labs in July

## 2018-03-07 NOTE — Progress Notes (Signed)
BP 120/70   Pulse 64   Temp 98 F (36.7 C) (Oral)   Resp 16   Ht 5\' 8"  (1.727 m)   Wt 139 lb 3.2 oz (63.1 kg)   SpO2 98%   BMI 21.17 kg/m    Subjective:    Patient ID: Corey Norman, male    DOB: Nov 03, 1966, 52 y.o.   MRN: [id number]  HPI: [name] is a 52 y.o. male  Chief Complaint  Patient presents with  . Follow-up    BS    HPI Patient is here for follow-up; he speaks Mandarin and we are using the language line for interpretation services Patient is new to me; previous provider left our practice  He has type 2 diabetes mellitus; controlled with diet alone; patient declines pneumonia vaccine when discussed; 10 years ago was over the level, but has controlled with strict diet since then; vision is good; no dry mouth; no eye exam recently, recommended but patient declined  Urine microalbumin:Cr UTD, done in July 2018 Lab Results  Component Value Date   HGBA1C 6.2 11/29/2017   He has high cholesterol Lab Results  Component Value Date   CHOL 181 12/06/2017   HDL 72 12/06/2017   LDLCALC 90 12/06/2017   TRIG 94 12/06/2017   CHOLHDL 2.5 12/06/2017    He has vitamin D deficiency; has 2000 iu now from the pharmacy; finished the 50k pills already; will check in July  He has a colonoscopy in [place]; was treated for H pylori gastritis; recheck breath test here was negative, Jan 2019  Depression screen [facility] 2/9 03/07/2018 11/29/2017 06/07/2017 06/29/2016  Decreased Interest 0 0 0 0  Down, Depressed, Hopeless 0 0 0 0  PHQ - 2 Score 0 0 0 0    Relevant past medical, surgical, family and social history reviewed Past Medical History:  Diagnosis Date  . Diabetes mellitus without complication (HCC)    Last A1c was normal at 5.8%, not on medication.   History reviewed. No pertinent surgical history. Family History  Problem Relation Age of Onset  . Hypertension Mother    Social History   Tobacco Use  . Smoking status: Never Smoker  . Smokeless tobacco: Never Used    Substance Use Topics  . Alcohol use: Yes    Alcohol/week: 0.6 oz    Types: 1 Glasses of wine per week  . Drug use: No    Interim medical history since last visit reviewed. Allergies and medications reviewed  Review of Systems Per HPI unless specifically indicated above     Objective:    BP 120/70   Pulse 64   Temp 98 F (36.7 C) (Oral)   Resp 16   Ht 5\' 8"  (1.727 m)   Wt 139 lb 3.2 oz (63.1 kg)   SpO2 98%   BMI 21.17 kg/m   Wt Readings from Last 3 Encounters:  03/07/18 139 lb 3.2 oz (63.1 kg)  11/29/17 144 lb 1.6 oz (65.4 kg)  06/07/17 142 lb 12.8 oz (64.8 kg)    Physical Exam Diabetic Foot Form - Detailed   Diabetic Foot Exam - detailed Diabetic Foot exam was performed with the following findings:  Yes 03/07/2018  7:30 AM  Visual Foot Exam completed.:  Yes  Pulse Foot Exam completed.:  Yes  Right Dorsalis Pedis:  Present Left Dorsalis Pedis:  Present  Sensory Foot Exam Completed.:  Yes Semmes-Weinstein Monofilament Test       Results for orders placed or  performed in visit on 11/29/17  CBC with Differential/Platelet  Result Value Ref Range   WBC 3.9 3.4 - 10.8 x10E3/uL   RBC 4.99 4.14 - 5.80 x10E6/uL   Hemoglobin 15.4 13.0 - 17.7 g/dL   Hematocrit 96.047.1 45.437.5 - 51.0 %   MCV 94 79 - 97 fL   MCH 30.9 26.6 - 33.0 pg   MCHC 32.7 31.5 - 35.7 g/dL   RDW 09.813.9 11.912.3 - 14.715.4 %   Platelets 218 150 - 379 x10E3/uL   Neutrophils 41 Not Estab. %   Lymphs 49 Not Estab. %   Monocytes 7 Not Estab. %   Eos 2 Not Estab. %   Basos 1 Not Estab. %   Neutrophils Absolute 1.6 1.4 - 7.0 x10E3/uL   Lymphocytes Absolute 2.0 0.7 - 3.1 x10E3/uL   Monocytes Absolute 0.3 0.1 - 0.9 x10E3/uL   EOS (ABSOLUTE) 0.1 0.0 - 0.4 x10E3/uL   Basophils Absolute 0.0 0.0 - 0.2 x10E3/uL   Immature Granulocytes 0 Not Estab. %   Immature Grans (Abs) 0.0 0.0 - 0.1 x10E3/uL  Comprehensive Metabolic Panel (CMET)  Result Value Ref Range   Glucose 117 (H) 65 - 99 mg/dL   BUN 16 6 - 24 mg/dL    Creatinine, Ser 8.290.74 (L) 0.76 - 1.27 mg/dL   GFR calc non Af Amer 106 >59 mL/min/1.73   GFR calc Af Amer 123 >59 mL/min/1.73   BUN/Creatinine Ratio 22 (H) 9 - 20   Sodium 144 134 - 144 mmol/L   Potassium 4.1 3.5 - 5.2 mmol/L   Chloride 104 96 - 106 mmol/L   CO2 23 20 - 29 mmol/L   Calcium 9.3 8.7 - 10.2 mg/dL   Total Protein 7.2 6.0 - 8.5 g/dL   Albumin 4.6 3.5 - 5.5 g/dL   Globulin, Total 2.6 1.5 - 4.5 g/dL   Albumin/Globulin Ratio 1.8 1.2 - 2.2   Bilirubin Total 0.6 0.0 - 1.2 mg/dL   Alkaline Phosphatase 80 39 - 117 IU/L   AST 19 0 - 40 IU/L   ALT 24 0 - 44 IU/L  Vitamin D (25 hydroxy)  Result Value Ref Range   Vit D, 25-Hydroxy 33.9 30.0 - 100.0 ng/mL  TSH  Result Value Ref Range   TSH 1.770 0.450 - 4.500 uIU/mL  PSA  Result Value Ref Range   Prostate Specific Ag, Serum 0.8 0.0 - 4.0 ng/mL  Lipid panel  Result Value Ref Range   Cholesterol, Total 181 100 - 199 mg/dL   Triglycerides 94 0 - 149 mg/dL   HDL 72 >56>39 mg/dL   VLDL Cholesterol Cal 19 5 - 40 mg/dL   LDL Calculated 90 0 - 99 mg/dL   Chol/HDL Ratio 2.5 0.0 - 5.0 ratio  H. pylori breath test  Result Value Ref Range   H pylori Breath Test Negative Negative  POCT HgB A1C  Result Value Ref Range   Hemoglobin A1C 6.2   POCT Glucose (CBG)  Result Value Ref Range   POC Glucose 136 (A) 70 - 99 mg/dl      Assessment & Plan:   Problem List Items Addressed This Visit      Other   Vitamin D insufficiency    Check labs in July      Hypercholesterolemia - Primary    Fair control without any medicines; patient does not want medicines      Hx of diabetes mellitus    Patient does not believe he has diabetes; indeed, the last few  years, he has been controlled with diet alone and does not believe he has DM at all; does not want meds; does not want intervention or aggressive therapy; will check A1c every 6 months; discussed eye exam, aspirin, statin, renal protection, and he does not want meds       Other Visit  Diagnoses    Onychomycosis       noted; patient has hx of DM, but not active, no meds for years       Follow up plan: Return in about 3 months (around 06/12/2018) for twenty minute follow-up with fasting labs, then every 6 months; 40 min.  An after-visit summary was printed and given to the patient at check-out.  Please see the patient instructions which may contain other information and recommendations beyond what is mentioned above in the assessment and plan.  Meds ordered this encounter  Medications  . cyanocobalamin ([facility] VITAMIN B12) 2000 MCG tablet    Sig: Take 1 tablet (2,000 mcg total) by mouth every other day.    No orders of the defined types were placed in this encounter.

## 2018-03-07 NOTE — Assessment & Plan Note (Addendum)
Fair control without any medicines; patient does not want medicines

## 2018-03-23 NOTE — Assessment & Plan Note (Signed)
Patient does not believe he has diabetes; indeed, the last few years, he has been controlled with diet alone and does not believe he has DM at all; does not want meds; does not want intervention or aggressive therapy; will check A1c every 6 months; discussed eye exam, aspirin, statin, renal protection, and he does not want meds

## 2018-06-13 ENCOUNTER — Ambulatory Visit: Payer: BLUE CROSS/BLUE SHIELD | Admitting: Family Medicine

## 2018-06-27 ENCOUNTER — Encounter: Payer: Self-pay | Admitting: Family Medicine

## 2018-06-27 ENCOUNTER — Ambulatory Visit: Payer: BLUE CROSS/BLUE SHIELD | Admitting: Family Medicine

## 2018-06-27 VITALS — BP 130/78 | HR 64 | Temp 98.0°F | Resp 12 | Ht 68.0 in | Wt 140.3 lb

## 2018-06-27 DIAGNOSIS — E119 Type 2 diabetes mellitus without complications: Secondary | ICD-10-CM

## 2018-06-27 DIAGNOSIS — E7800 Pure hypercholesterolemia, unspecified: Secondary | ICD-10-CM

## 2018-06-27 DIAGNOSIS — E559 Vitamin D deficiency, unspecified: Secondary | ICD-10-CM

## 2018-06-27 DIAGNOSIS — Z8639 Personal history of other endocrine, nutritional and metabolic disease: Secondary | ICD-10-CM

## 2018-06-27 DIAGNOSIS — R8290 Unspecified abnormal findings in urine: Secondary | ICD-10-CM

## 2018-06-27 DIAGNOSIS — B353 Tinea pedis: Secondary | ICD-10-CM

## 2018-06-27 NOTE — Assessment & Plan Note (Signed)
Check labs today.

## 2018-06-27 NOTE — Progress Notes (Signed)
BP 130/78   Pulse 64   Temp 98 F (36.7 C) (Oral)   Resp 12   Ht 5\' 8"  (1.727 m)   Wt 140 lb 4.8 oz (63.6 kg)   SpO2 95%   BMI 21.33 kg/m    Subjective:    Patient ID: Corey Norman, male    DOB: Feb 21, 1966, 52 y.o.   MRN: [id number]  HPI: Corey Norman is a 52 y.o. male  Chief Complaint  Patient presents with  . Follow-up    HPI Patient is here for f/u Type 2 diabetes No problems with feet No dry mouth or blurred vision Not checking sugars in a long time (no home FSBS) He declines eye exam and pneumonia vaccine  High cholesterol; not taking any medicine at this time  Vitamin D deficiency in the past; last 3 vitamin D levels reviewed, and most recent was back to normal  Alcohol intake; he drinks every day; [employer], just 1 or 2 bottles a day   [facility] Visit from 06/27/2018 in [facility]  AUDIT-C Score  8     He says that he had an abnormal urine in the past; he does not see any blood in his urine; he denies dysuria; he just remembers that something in his urine was "high"  Depression screen [facility] 2/9 06/27/2018 03/07/2018 11/29/2017 06/07/2017 06/29/2016  Decreased Interest 0 0 0 0 0  Down, Depressed, Hopeless 0 0 0 0 0  PHQ - 2 Score 0 0 0 0 0    Relevant past medical, surgical, family and social history reviewed Past Medical History:  Diagnosis Date  . Diabetes mellitus without complication (HCC)    Last A1c was normal at 5.8%, not on medication.   History reviewed. No pertinent surgical history. Family History  Problem Relation Age of Onset  . Hypertension Mother    Social History   Tobacco Use  . Smoking status: Never Smoker  . Smokeless tobacco: Never Used  Substance Use Topics  . Alcohol use: Yes    Alcohol/week: 0.0 oz    Comment: Patient states 1-2 bottles a night  . Drug use: No    Interim medical history since last visit reviewed. Allergies and medications reviewed  Review of Systems Per HPI unless specifically indicated above    Objective:    BP 130/78   Pulse 64   Temp 98 F (36.7 C) (Oral)   Resp 12   Ht 5\' 8"  (1.727 m)   Wt 140 lb 4.8 oz (63.6 kg)   SpO2 95%   BMI 21.33 kg/m   Wt Readings from Last 3 Encounters:  06/27/18 140 lb 4.8 oz (63.6 kg)  03/07/18 139 lb 3.2 oz (63.1 kg)  11/29/17 144 lb 1.6 oz (65.4 kg)    Physical Exam  Constitutional: He appears well-developed and well-nourished. No distress.  Eyes: No scleral icterus.  Cardiovascular: Normal rate and regular rhythm.  Pulmonary/Chest: Effort normal and breath sounds normal.  Neurological: He is alert.  Skin: No pallor.  Psychiatric: He has a normal mood and affect.   Results for orders placed or performed in visit on 11/29/17  CBC with Differential/Platelet  Result Value Ref Range   WBC 3.9 3.4 - 10.8 x10E3/uL   RBC 4.99 4.14 - 5.80 x10E6/uL   Hemoglobin 15.4 13.0 - 17.7 g/dL   Hematocrit 40.9 81.1 - 51.0 %   MCV 94 79 - 97 fL   MCH 30.9 26.6 - 33.0 pg   MCHC  32.7 31.5 - 35.7 g/dL   RDW 04.513.9 40.912.3 - 81.115.4 %   Platelets 218 150 - 379 x10E3/uL   Neutrophils 41 Not Estab. %   Lymphs 49 Not Estab. %   Monocytes 7 Not Estab. %   Eos 2 Not Estab. %   Basos 1 Not Estab. %   Neutrophils Absolute 1.6 1.4 - 7.0 x10E3/uL   Lymphocytes Absolute 2.0 0.7 - 3.1 x10E3/uL   Monocytes Absolute 0.3 0.1 - 0.9 x10E3/uL   EOS (ABSOLUTE) 0.1 0.0 - 0.4 x10E3/uL   Basophils Absolute 0.0 0.0 - 0.2 x10E3/uL   Immature Granulocytes 0 Not Estab. %   Immature Grans (Abs) 0.0 0.0 - 0.1 x10E3/uL  Comprehensive Metabolic Panel (CMET)  Result Value Ref Range   Glucose 117 (H) 65 - 99 mg/dL   BUN 16 6 - 24 mg/dL   Creatinine, Ser 9.140.74 (L) 0.76 - 1.27 mg/dL   GFR calc non Af Amer 106 >59 mL/min/1.73   GFR calc Af Amer 123 >59 mL/min/1.73   BUN/Creatinine Ratio 22 (H) 9 - 20   Sodium 144 134 - 144 mmol/L   Potassium 4.1 3.5 - 5.2 mmol/L   Chloride 104 96 - 106 mmol/L   CO2 23 20 - 29 mmol/L   Calcium 9.3 8.7 - 10.2 mg/dL   Total Protein 7.2 6.0 - 8.5  g/dL   Albumin 4.6 3.5 - 5.5 g/dL   Globulin, Total 2.6 1.5 - 4.5 g/dL   Albumin/Globulin Ratio 1.8 1.2 - 2.2   Bilirubin Total 0.6 0.0 - 1.2 mg/dL   Alkaline Phosphatase 80 39 - 117 IU/L   AST 19 0 - 40 IU/L   ALT 24 0 - 44 IU/L  Vitamin D (25 hydroxy)  Result Value Ref Range   Vit D, 25-Hydroxy 33.9 30.0 - 100.0 ng/mL  TSH  Result Value Ref Range   TSH 1.770 0.450 - 4.500 uIU/mL  PSA  Result Value Ref Range   Prostate Specific Ag, Serum 0.8 0.0 - 4.0 ng/mL  Lipid panel  Result Value Ref Range   Cholesterol, Total 181 100 - 199 mg/dL   Triglycerides 94 0 - 149 mg/dL   HDL 72 >78>39 mg/dL   VLDL Cholesterol Cal 19 5 - 40 mg/dL   LDL Calculated 90 0 - 99 mg/dL   Chol/HDL Ratio 2.5 0.0 - 5.0 ratio  H. pylori breath test  Result Value Ref Range   H pylori Breath Test Negative Negative  POCT HgB A1C  Result Value Ref Range   Hemoglobin A1C 6.2   POCT Glucose (CBG)  Result Value Ref Range   POC Glucose 136 (A) 70 - 99 mg/dl      Assessment & Plan:   Problem List Items Addressed This Visit      Musculoskeletal and Integument   Athlete's foot    Try tolnaftate, written on AVS and high-lighted so he can find that at the pharmacy        Other   Vitamin D insufficiency    Last vit D back to normal      Hypercholesterolemia    Check labs today      Relevant Orders   Lipid panel   Hx of diabetes mellitus    Check A1c and glucose today; foot exam by MD; patient does not want to have eye exam or pneumonia vaccine       Other Visit Diagnoses    Diabetes mellitus without complication (HCC)    -  Primary  Relevant Orders   Microalbumin / creatinine urine ratio   Lipid panel   Hemoglobin A1c   COMPLETE METABOLIC PANEL WITH GFR   Abnormal urine       patient reports something "high" in his urine years ago; will check urine dip and micro today   Relevant Orders   Urinalysis w microscopic + reflex cultur       Follow up plan: Return in about 6 months (around  12/28/2018) for follow-up visit with Dr. Sherie Don.  An after-visit summary was printed and given to the patient at check-out.  Please see the patient instructions which may contain other information and recommendations beyond what is mentioned above in the assessment and plan.  No orders of the defined types were placed in this encounter.   Orders Placed This Encounter  Procedures  . Microalbumin / creatinine urine ratio  . Lipid panel  . Hemoglobin A1c  . COMPLETE METABOLIC PANEL WITH GFR  . Urinalysis w microscopic + reflex cultur

## 2018-06-27 NOTE — Assessment & Plan Note (Addendum)
Last vit D back to normal

## 2018-06-27 NOTE — Patient Instructions (Signed)
Medicine for feet:  Tolnaftate  We will get labs today If you have not heard anything from my staff in a week about any orders/referrals/studies from today, please contact us here to follow-up [phone]

## 2018-06-27 NOTE — Assessment & Plan Note (Addendum)
Check A1c and glucose today; foot exam by MD; patient does not want to have eye exam or pneumonia vaccine

## 2018-06-27 NOTE — Assessment & Plan Note (Addendum)
Try tolnaftate, written on AVS and high-lighted so he can find that at the pharmacy

## 2018-06-28 ENCOUNTER — Other Ambulatory Visit: Payer: Self-pay | Admitting: Family Medicine

## 2018-06-28 LAB — LIPID PANEL
Cholesterol: 207 mg/dL — ABNORMAL HIGH (ref <200)
HDL: 71 mg/dL (ref >40)
LDL Cholesterol (Calc): 110 mg/dL (calc) — ABNORMAL HIGH
Non-HDL Cholesterol (Calc): 136 mg/dL (calc) — ABNORMAL HIGH (ref <130)
TRIGLYCERIDES: 146 mg/dL (ref <150)
Total CHOL/HDL Ratio: 2.9 (calc) (ref <5.0)

## 2018-06-28 LAB — COMPLETE METABOLIC PANEL WITH GFR
AG RATIO: 1.8 (calc) (ref 1.0–2.5)
ALBUMIN MSPROF: 4.7 g/dL (ref 3.6–5.1)
ALT: 32 U/L (ref 9–46)
AST: 27 U/L (ref 10–35)
Alkaline phosphatase (APISO): 66 U/L (ref 40–115)
BILIRUBIN TOTAL: 1.1 mg/dL (ref 0.2–1.2)
BUN: 16 mg/dL (ref 7–25)
CHLORIDE: 104 mmol/L (ref 98–110)
CO2: 28 mmol/L (ref 20–32)
Calcium: 9.6 mg/dL (ref 8.6–10.3)
Creat: 0.89 mg/dL (ref 0.70–1.33)
GFR, EST AFRICAN AMERICAN: 114 mL/min/{1.73_m2} (ref >=60)
GFR, Est Non African American: 98 mL/min/{1.73_m2} (ref >=60)
Globulin: 2.6 g/dL (calc) (ref 1.9–3.7)
Glucose, Bld: 119 mg/dL — ABNORMAL HIGH (ref 65–99)
POTASSIUM: 4.7 mmol/L (ref 3.5–5.3)
Sodium: 140 mmol/L (ref 135–146)
TOTAL PROTEIN: 7.3 g/dL (ref 6.1–8.1)

## 2018-06-28 LAB — URINALYSIS W MICROSCOPIC + REFLEX CULTURE
BACTERIA UA: NONE SEEN /HPF
Bilirubin Urine: NEGATIVE
Glucose, UA: NEGATIVE
HYALINE CAST: NONE SEEN /LPF
Hgb urine dipstick: NEGATIVE
Ketones, ur: NEGATIVE
LEUKOCYTE ESTERASE: NEGATIVE
Nitrites, Initial: NEGATIVE
PH: 6 (ref 5.0–8.0)
Protein, ur: NEGATIVE
RBC / HPF: NONE SEEN /HPF (ref 0–2)
Specific Gravity, Urine: 1.023 (ref 1.001–1.03)
Squamous Epithelial / LPF: NONE SEEN /HPF (ref <=5)
WBC, UA: NONE SEEN /HPF (ref 0–5)

## 2018-06-28 LAB — HEMOGLOBIN A1C
Hgb A1c MFr Bld: 6.2 % of total Hgb — ABNORMAL HIGH (ref <5.7)
Mean Plasma Glucose: 131 (calc)
eAG (mmol/L): 7.3 (calc)

## 2018-06-28 LAB — NO CULTURE INDICATED

## 2018-06-28 LAB — MICROALBUMIN / CREATININE URINE RATIO
Creatinine, Urine: 189 mg/dL (ref 20–320)
MICROALB UR: 0.9 mg/dL
MICROALB/CREAT RATIO: 5 ug/mg{creat} (ref <30)

## 2018-06-28 MED ORDER — METFORMIN HCL ER 500 MG PO TB24: 500.0000 mg | ORAL_TABLET | Freq: Every day | ORAL | 5 refills | Status: DC

## 2018-06-28 MED ORDER — ATORVASTATIN CALCIUM 10 MG PO TABS: 10.0000 mg | ORAL_TABLET | Freq: Every day | ORAL | 5 refills | Status: DC

## 2018-06-28 NOTE — Progress Notes (Signed)
Start statin and metformin

## 2018-07-25 ENCOUNTER — Ambulatory Visit: Payer: Self-pay
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
## 2018-07-31 ENCOUNTER — Ambulatory Visit: Payer: Self-pay | Admitting: Family Medicine

## 2018-08-29 ENCOUNTER — Other Ambulatory Visit: Payer: Self-pay

## 2018-08-29 DIAGNOSIS — Z1211 Encounter for screening for malignant neoplasm of colon: Secondary | ICD-10-CM

## 2018-09-13 ENCOUNTER — Encounter: Payer: Self-pay | Admitting: *Deleted

## 2019-01-02 ENCOUNTER — Ambulatory Visit (INDEPENDENT_AMBULATORY_CARE_PROVIDER_SITE_OTHER): Payer: BLUE CROSS/BLUE SHIELD | Admitting: Family Medicine

## 2019-01-02 ENCOUNTER — Encounter: Payer: Self-pay | Admitting: Family Medicine

## 2019-01-02 VITALS — BP 126/74 | HR 63 | Temp 97.9°F | Resp 12 | Ht 68.0 in | Wt 151.4 lb

## 2019-01-02 DIAGNOSIS — B353 Tinea pedis: Secondary | ICD-10-CM | POA: Diagnosis not present

## 2019-01-02 DIAGNOSIS — E7800 Pure hypercholesterolemia, unspecified: Secondary | ICD-10-CM | POA: Diagnosis not present

## 2019-01-02 DIAGNOSIS — Z5181 Encounter for therapeutic drug level monitoring: Secondary | ICD-10-CM | POA: Diagnosis not present

## 2019-01-02 DIAGNOSIS — Z8639 Personal history of other endocrine, nutritional and metabolic disease: Secondary | ICD-10-CM

## 2019-01-02 NOTE — Assessment & Plan Note (Signed)
Patient is not taking any medicine for this, does not want to unless he absolutely has to; will check A1c today; foot exam by MD today; he does not sound like he is going to limit white rice, staple in his diet; he is avoiding sugary drinks, though

## 2019-01-02 NOTE — Assessment & Plan Note (Addendum)
Check labs today; we discussed the risks of high LDL, can lead to heart attack or stroke; low saturated fats encouraged; he says he would like to see what the labs show and then he might consider a medicine

## 2019-01-02 NOTE — Progress Notes (Signed)
BP 126/74   Pulse 63   Temp 97.9 F (36.6 C) (Oral)   Resp 12   Ht 5\' 8"  (1.727 m)   Wt 151 lb 6.4 oz (68.7 kg)   SpO2 99%   BMI 23.02 kg/m    Subjective:    Patient ID: Corey SearingBao Q Kwiecinski, male    DOB: 11/26/1965, 53 y.o.   MRN: [id number]  HPI: Corey Norman is a [age] y.o. male  Chief Complaint  Patient presents with  . Follow-up    HPI Here for f/u; Congohinese (Mandarin) interpreter phone service utilized through visit Last labs 6 months ago Hx of diabetes; no dry mouth; no blurry vision He never started the metformin; he does not want to take medicine; no one in the family has diabetes; no sweet drinks or soda; rice is white rice  High cholesterol; LDL 110; he is not taking the cholesterol medicine; he never started it he says; he doesn't want to take medicine  Rough skin on the feet; no other problems with feet   [facility] 01/02/2019 06/27/2018 03/07/2018 11/29/2017 06/07/2017  Decreased Interest 0 0 0 0 0  Down, Depressed, Hopeless 0 0 0 0 0  PHQ - 2 Score 0 0 0 0 0  Altered sleeping 0 - - - -  Tired, decreased energy 0 - - - -  Change in appetite 0 - - - -  Feeling bad or failure about yourself  0 - - - -  Trouble concentrating 0 - - - -  Moving slowly or fidgety/restless 0 - - - -  Suicidal thoughts 0 - - - -  PHQ-9 Score 0 - - - -  Difficult doing work/chores Not difficult at all - - - -   Fall Risk  01/02/2019 06/27/2018 03/07/2018 11/29/2017 06/07/2017  Falls in the past year? 0 No No No No  Number falls in past yr: 0 - - - -  Injury with Fall? 0 - - - -    Relevant past medical, surgical, family and social history reviewed Past Medical History:  Diagnosis Date  . Diabetes mellitus without complication (HCC)    Last A1c was normal at 5.8%, not on medication.   History reviewed. No pertinent surgical history.   Family History  Problem Relation Age of Onset  . Hypertension Mother    Social History   Tobacco Use  . Smoking status: Never Smoker  . Smokeless  tobacco: Never Used  Substance Use Topics  . Alcohol use: Yes    Alcohol/week: 0.0 standard drinks    Comment: wine per night  . Drug use: No     [facility]  AUDIT-C Score  4    MD note: no smoking, drink just a little bit; little bit of red wine  Interim medical history since last visit reviewed. Allergies and medications reviewed  Review of Systems  Respiratory: Negative for shortness of breath.   Cardiovascular: Negative for chest pain.  Gastrointestinal: Negative for blood in stool.  Genitourinary: Negative for hematuria.   Per HPI unless specifically indicated above     Objective:    BP 126/74   Pulse 63   Temp 97.9 F (36.6 C) (Oral)   Resp 12   Ht 5\' 8"  (1.727 m)   Wt 151 lb 6.4 oz (68.7 kg)   SpO2 99%   BMI 23.02 kg/m   Wt Readings from Last 3 Encounters:  01/02/19 151  lb 6.4 oz (68.7 kg)  06/27/18 140 lb 4.8 oz (63.6 kg)  03/07/18 139 lb 3.2 oz (63.1 kg)    Physical Exam Constitutional:      General: He is not in acute distress.    Appearance: He is well-developed.  HENT:     Head: Normocephalic and atraumatic.  Eyes:     General: No scleral icterus. Neck:     Thyroid: No thyromegaly.  Cardiovascular:     Rate and Rhythm: Normal rate and regular rhythm.  Pulmonary:     Effort: Pulmonary effort is normal.     Breath sounds: Normal breath sounds.  Abdominal:     General: Bowel sounds are normal. There is no distension.     Palpations: Abdomen is soft.     Tenderness: There is no abdominal tenderness.  Skin:    General: Skin is warm and dry.     Coloration: Skin is not pale.  Neurological:     Mental Status: He is alert.     Coordination: Coordination normal.  Psychiatric:        Behavior: Behavior normal.        Thought Content: Thought content normal.        Judgment: Judgment normal.    Diabetic Foot Form - Detailed   Diabetic Foot Exam - detailed Diabetic Foot exam was performed  with the following findings:  Yes 01/02/2019 10:52 AM  Visual Foot Exam completed.:  Yes  Pulse Foot Exam completed.:  Yes  Right Dorsalis Pedis:  Present Left Dorsalis Pedis:  Present  Sensory Foot Exam Completed.:  Yes Semmes-Weinstein Monofilament Test R Site 1-Great Toe:  Pos L Site 1-Great Toe:  Pos    Comments:  Dry scaly skin on the soles of the feet     Results for orders placed or performed in visit on 06/27/18  Urinalysis w microscopic + reflex cultur  Result Value Ref Range   Color, Urine YELLOW YELLOW   APPearance CLEAR CLEAR   Specific Gravity, Urine 1.023 1.001 - 1.03   pH 6.0 5.0 - 8.0   Glucose, UA NEGATIVE NEGATIVE   Bilirubin Urine NEGATIVE NEGATIVE   Ketones, ur NEGATIVE NEGATIVE   Hgb urine dipstick NEGATIVE NEGATIVE   Protein, ur NEGATIVE NEGATIVE   Nitrites, Initial NEGATIVE NEGATIVE   Leukocyte Esterase NEGATIVE NEGATIVE   WBC, UA NONE SEEN 0 - 5 /HPF   RBC / HPF NONE SEEN 0 - 2 /HPF   Squamous Epithelial / LPF NONE SEEN < OR = 5 /HPF   Bacteria, UA NONE SEEN NONE SEEN /HPF   Hyaline Cast NONE SEEN NONE SEEN /LPF  Microalbumin / creatinine urine ratio  Result Value Ref Range   Creatinine, Urine 189 20 - 320 mg/dL   Microalb, Ur 0.9 mg/dL   Microalb Creat Ratio 5 <30 mcg/mg creat  Lipid panel  Result Value Ref Range   Cholesterol 207 (H) <200 mg/dL   HDL 71 >40>40 mg/dL   Triglycerides 981146 <191<150 mg/dL   LDL Cholesterol (Calc) 110 (H) mg/dL (calc)   Total CHOL/HDL Ratio 2.9 <5.0 (calc)   Non-HDL Cholesterol (Calc) 136 (H) <130 mg/dL (calc)  COMPLETE METABOLIC PANEL WITH GFR  Result Value Ref Range   Glucose, Bld 119 (H) 65 - 99 mg/dL   BUN 16 7 - 25 mg/dL   Creat 4.780.89 2.950.70 - 6.211.33 mg/dL   GFR, Est Non African American 98 > OR = 60 mL/min/1.4573m2   GFR, Est African American 114 > OR =  60 mL/min/1.47m2   BUN/Creatinine Ratio NOT APPLICABLE 6 - 22 (calc)   Sodium 140 135 - 146 mmol/L   Potassium 4.7 3.5 - 5.3 mmol/L   Chloride 104 98 - 110 mmol/L    CO2 28 20 - 32 mmol/L   Calcium 9.6 8.6 - 10.3 mg/dL   Total Protein 7.3 6.1 - 8.1 g/dL   Albumin 4.7 3.6 - 5.1 g/dL   Globulin 2.6 1.9 - 3.7 g/dL (calc)   AG Ratio 1.8 1.0 - 2.5 (calc)   Total Bilirubin 1.1 0.2 - 1.2 mg/dL   Alkaline phosphatase (APISO) 66 40 - 115 U/L   AST 27 10 - 35 U/L   ALT 32 9 - 46 U/L  Hemoglobin A1c  Result Value Ref Range   Hgb A1c MFr Bld 6.2 (H) <5.7 % of total Hgb   Mean Plasma Glucose 131 (calc)   eAG (mmol/L) 7.3 (calc)  REFLEXIVE URINE CULTURE  Result Value Ref Range   Reflexve Urine Culture NO CULTURE INDICATED       Assessment & Plan:   Problem List Items Addressed This Visit      Musculoskeletal and Integument   Athlete's foot    Try tolnaftate        Other   Hypercholesterolemia    Check labs today; we discussed the risks of high LDL, can lead to heart attack or stroke; low saturated fats encouraged; he says he would like to see what the labs show and then he might consider a medicine      Relevant Orders   Lipid panel   Hx of diabetes mellitus - Primary    Patient is not taking any medicine for this, does not want to unless he absolutely has to; will check A1c today; foot exam by MD today; he does not sound like he is going to limit white rice, staple in his diet; he is avoiding sugary drinks, though      Relevant Orders   Hemoglobin A1C    Other Visit Diagnoses    Medication monitoring encounter       Relevant Orders   COMPLETE METABOLIC PANEL WITH GFR       Follow up plan: Return in about 6 months (around 07/03/2019) for follow-up visit with Dr. Sherie Don.  An after-visit summary was printed and given to the patient at check-out.  Please see the patient instructions which may contain other information and recommendations beyond what is mentioned above in the assessment and plan.  No orders of the defined types were placed in this encounter.   Orders Placed This Encounter  Procedures  . Hemoglobin A1C  . Lipid panel  .  COMPLETE METABOLIC PANEL WITH GFR

## 2019-01-02 NOTE — Assessment & Plan Note (Signed)
Try tolnaftate

## 2019-01-02 NOTE — Patient Instructions (Addendum)
Try tolnaftate cream for the feet, apply once a day  We'll try to get more hand-outs in Mandarin for you

## 2019-01-03 ENCOUNTER — Other Ambulatory Visit: Payer: Self-pay | Admitting: Family Medicine

## 2019-01-03 LAB — HEMOGLOBIN A1C
HEMOGLOBIN A1C: 6.4 %{Hb} — AB (ref <5.7)
Mean Plasma Glucose: 137 (calc)
eAG (mmol/L): 7.6 (calc)

## 2019-01-03 LAB — COMPLETE METABOLIC PANEL WITH GFR
AG Ratio: 2 (calc) (ref 1.0–2.5)
ALBUMIN MSPROF: 4.5 g/dL (ref 3.6–5.1)
ALT: 26 U/L (ref 9–46)
AST: 19 U/L (ref 10–35)
Alkaline phosphatase (APISO): 68 U/L (ref 35–144)
BUN: 17 mg/dL (ref 7–25)
CALCIUM: 9.1 mg/dL (ref 8.6–10.3)
CO2: 27 mmol/L (ref 20–32)
CREATININE: 0.75 mg/dL (ref 0.70–1.33)
Chloride: 107 mmol/L (ref 98–110)
GFR, EST AFRICAN AMERICAN: 121 mL/min/{1.73_m2} (ref >=60)
GFR, EST NON AFRICAN AMERICAN: 105 mL/min/{1.73_m2} (ref >=60)
GLOBULIN: 2.3 g/dL (ref 1.9–3.7)
Glucose, Bld: 125 mg/dL — ABNORMAL HIGH (ref 65–99)
Potassium: 4.2 mmol/L (ref 3.5–5.3)
SODIUM: 141 mmol/L (ref 135–146)
TOTAL PROTEIN: 6.8 g/dL (ref 6.1–8.1)
Total Bilirubin: 0.4 mg/dL (ref 0.2–1.2)

## 2019-01-03 LAB — LIPID PANEL
CHOL/HDL RATIO: 4.2 (calc) (ref <5.0)
CHOLESTEROL: 216 mg/dL — AB (ref <200)
HDL: 52 mg/dL (ref >=40)
Non-HDL Cholesterol (Calc): 164 mg/dL (calc) — ABNORMAL HIGH (ref <130)
Triglycerides: 503 mg/dL — ABNORMAL HIGH (ref <150)

## 2019-01-03 MED ORDER — ATORVASTATIN CALCIUM 10 MG PO TABS: 10.0000 mg | ORAL_TABLET | Freq: Every day | ORAL | 1 refills | Status: DC

## 2019-01-03 MED ORDER — OMEGA-3-ACID ETHYL ESTERS 1 G PO CAPS
2.0000 g | ORAL_CAPSULE | Freq: Two times a day (BID) | ORAL | 11 refills | Status: DC
Start: 2019-01-03 — End: 2019-02-13

## 2019-01-03 NOTE — Progress Notes (Signed)
Lipid lowering med recommended Recheck labs in 6 weeks, CMA to order

## 2019-01-03 NOTE — Progress Notes (Signed)
Corey Norman, please let the patient know that his 3 month blood sugar has gone up, but still controlled; if he does not want medicine, then just really watch diet and try to walk and get exercise; if not already exercising, start low and build up slowly His triglycerides are over 500, so he really needs cholesterol medicine; we talked at his visit about plaque build up which can lead to heart attacks and strokes, so I will encourage him to take the cholesterol medicine Recheck fasting lipids in 6 weeks (please ORDER) Thank you

## 2019-01-23 ENCOUNTER — Telehealth: Payer: Self-pay | Admitting: Family Medicine

## 2019-01-23 DIAGNOSIS — K2970 Gastritis, unspecified, without bleeding: Principal | ICD-10-CM

## 2019-01-23 DIAGNOSIS — B9681 Helicobacter pylori [H. pylori] as the cause of diseases classified elsewhere: Secondary | ICD-10-CM

## 2019-01-23 NOTE — Telephone Encounter (Signed)
Toniann Fail stated that her dad would like a referral to see a GI specialist if possible.  He wants to check to see if his previous infection in his intestine from last year when he was seen in [place] has gotten better.

## 2019-01-23 NOTE — Telephone Encounter (Signed)
What would be dx?

## 2019-01-23 NOTE — Telephone Encounter (Signed)
That's fine to order

## 2019-01-23 NOTE — Telephone Encounter (Signed)
I don't know It wasn't in the first note You'll have to ask them (H pylori or colitis or something else? I don't know)

## 2019-01-29 ENCOUNTER — Encounter: Payer: Self-pay | Admitting: *Deleted

## 2019-02-06 ENCOUNTER — Ambulatory Visit: Payer: Self-pay | Admitting: Gastroenterology

## 2019-02-12 ENCOUNTER — Other Ambulatory Visit: Payer: Self-pay

## 2019-02-12 DIAGNOSIS — E7800 Pure hypercholesterolemia, unspecified: Secondary | ICD-10-CM

## 2019-02-13 ENCOUNTER — Other Ambulatory Visit: Payer: Self-pay | Admitting: Family Medicine

## 2019-02-13 DIAGNOSIS — E7800 Pure hypercholesterolemia, unspecified: Secondary | ICD-10-CM

## 2019-02-13 DIAGNOSIS — E781 Pure hyperglyceridemia: Secondary | ICD-10-CM

## 2019-02-13 LAB — LIPID PANEL
CHOL/HDL RATIO: 3.7 (calc) (ref <5.0)
Cholesterol: 231 mg/dL — ABNORMAL HIGH (ref <200)
HDL: 63 mg/dL (ref >=40)
Non-HDL Cholesterol (Calc): 168 mg/dL (calc) — ABNORMAL HIGH (ref <130)
Triglycerides: 463 mg/dL — ABNORMAL HIGH (ref <150)

## 2019-02-13 MED ORDER — ICOSAPENT ETHYL 1 G PO CAPS: 2.0000 g | ORAL_CAPSULE | Freq: Two times a day (BID) | ORAL | 0 refills | Status: DC

## 2019-02-13 MED ORDER — ATORVASTATIN CALCIUM 20 MG PO TABS: 20.0000 mg | ORAL_TABLET | Freq: Every day | ORAL | 0 refills | Status: DC

## 2019-02-22 ENCOUNTER — Ambulatory Visit: Payer: Self-pay | Admitting: Gastroenterology

## 2019-02-27 ENCOUNTER — Ambulatory Visit: Payer: Self-pay | Admitting: Gastroenterology

## 2019-04-10 ENCOUNTER — Ambulatory Visit: Payer: Self-pay | Admitting: Gastroenterology

## 2019-05-15 ENCOUNTER — Telehealth: Payer: Self-pay | Admitting: Family Medicine

## 2019-05-15 NOTE — Telephone Encounter (Signed)
-----   Message from Hubbard Hartshorn, [place] sent at 02/13/2019  8:46 AM EDT ----- Regarding: Please call patient to come in for fasting lipid recheck Please call patient to come in for fasting lipid recheck.

## 2019-05-15 NOTE — Telephone Encounter (Signed)
Left message on voicemail.

## 2019-06-05 ENCOUNTER — Ambulatory Visit: Payer: Self-pay | Admitting: Gastroenterology

## 2019-07-03 ENCOUNTER — Ambulatory Visit: Payer: BLUE CROSS/BLUE SHIELD | Admitting: Family Medicine

## 2019-07-17 ENCOUNTER — Encounter: Payer: Self-pay | Admitting: Nurse Practitioner

## 2019-07-17 ENCOUNTER — Ambulatory Visit (INDEPENDENT_AMBULATORY_CARE_PROVIDER_SITE_OTHER): Payer: BLUE CROSS/BLUE SHIELD | Admitting: Nurse Practitioner

## 2019-07-17 ENCOUNTER — Other Ambulatory Visit: Payer: Self-pay

## 2019-07-17 VITALS — BP 136/76 | HR 64 | Temp 96.9°F | Resp 14 | Ht 65.0 in | Wt 148.1 lb

## 2019-07-17 DIAGNOSIS — E781 Pure hyperglyceridemia: Secondary | ICD-10-CM | POA: Diagnosis not present

## 2019-07-17 DIAGNOSIS — E119 Type 2 diabetes mellitus without complications: Secondary | ICD-10-CM | POA: Diagnosis not present

## 2019-07-17 DIAGNOSIS — E7800 Pure hypercholesterolemia, unspecified: Secondary | ICD-10-CM

## 2019-07-17 DIAGNOSIS — Z5181 Encounter for therapeutic drug level monitoring: Secondary | ICD-10-CM | POA: Diagnosis not present

## 2019-07-17 MED ORDER — ICOSAPENT ETHYL 1 G PO CAPS: 1.0000 g | ORAL_CAPSULE | Freq: Two times a day (BID) | ORAL | 1 refills | Status: DC

## 2019-07-17 MED ORDER — ATORVASTATIN CALCIUM 20 MG PO TABS: 20.0000 mg | ORAL_TABLET | Freq: Every day | ORAL | 1 refills | Status: DC

## 2019-07-17 NOTE — Progress Notes (Signed)
Name: Corey Norman   MRN: [id number]    DOB: 12/04/1965   Date:07/17/2019       Progress Note  Subjective  Chief Complaint  Chief Complaint  Patient presents with  . Follow-up  . Medication Refill    HPI   Hyperlipidemia Patient rx  Lipitor 20mg  daily, vascepa 2g BID. He is taking lipitor daily, vascepa he is doing 1 tablet in the morning and 1 tablet in the evening  Diet: eats a lot vegetables and rice, eats smaller portions of meat. A few days a week.  Denies myalgias Lab Results  Component Value Date   CHOL 231 (H) 02/12/2019   HDL 63 02/12/2019   LDLCALC  02/12/2019     Comment:     . LDL cholesterol not calculated. Triglyceride levels greater than 400 mg/dL invalidate calculated LDL results. . Reference range: <100 . Desirable range <100 mg/dL for primary prevention;   <70 mg/dL for patients with CHD or diabetic patients  with > or = 2 CHD risk factors. Marland Kitchen. LDL-C is now calculated using the Martin-Hopkins  calculation, which is a validated novel method providing  better accuracy than the Friedewald equation in the  estimation of LDL-C.  Horald PollenMartin SS et al. Lenox AhrJAMA. 4098;119(142013;310(19): 2061-2068  (http://education.[facility].com/faq/FAQ164)    TRIG 463 (H) 02/12/2019   CHOLHDL 3.7 02/12/2019    Diabetes Diet controlled; last 4 A1C's seen in prediabetic range.  Is on statin, not on ACEi  Has never been to eye doctor  Denies polyphagia, polydipsia, polyphagia Lab Results  Component Value Date   HGBA1C 6.4 (H) 01/02/2019       PHQ2/9: [facility] 07/17/2019 01/02/2019 06/27/2018 03/07/2018 11/29/2017  Decreased Interest 0 0 0 0 0  Down, Depressed, Hopeless 0 0 0 0 0  PHQ - 2 Score 0 0 0 0 0  Altered sleeping 0 0 - - -  Tired, decreased energy 0 0 - - -  Change in appetite 0 0 - - -  Feeling bad or failure about yourself  0 0 - - -  Trouble concentrating 0 0 - - -  Moving slowly or fidgety/restless 0 0 - - -  Suicidal thoughts 0 0 - - -  PHQ-9 Score 0  0 - - -  Difficult doing work/chores Not difficult at all Not difficult at all - - -     PHQ reviewed. Negative  Patient Active Problem List   Diagnosis Date Noted  . Athlete's foot 06/27/2018  . Hx of diabetes mellitus 06/07/2017  . Hypercholesterolemia 06/07/2017  . Vitamin D insufficiency 06/07/2017    Past Medical History:  Diagnosis Date  . Diabetes mellitus without complication (HCC)    Last A1c was normal at 5.8%, not on medication.    History reviewed. No pertinent surgical history.  Social History   Tobacco Use  . Smoking status: Never Smoker  . Smokeless tobacco: Never Used  Substance Use Topics  . Alcohol use: Yes    Alcohol/week: 0.0 standard drinks    Comment: wine per night     Current Outpatient Medications:  .  atorvastatin (LIPITOR) 20 MG tablet, Take 1 tablet (20 mg total) by mouth daily., Disp: 90 tablet, Rfl: 1 .  Icosapent Ethyl 1 g CAPS, Take 1 capsule (1 g total) by mouth 2 (two) times daily., Disp: 180 capsule, Rfl: 1  No Known Allergies  Review of Systems  Constitutional: Negative for chills, fever and malaise/fatigue.  Respiratory: Negative for cough and shortness  of breath.   Cardiovascular: Negative for chest pain, palpitations and leg swelling.  Gastrointestinal: Negative for abdominal pain.  Musculoskeletal: Negative for joint pain and myalgias.  Skin: Negative for rash.  Neurological: Negative for dizziness and headaches.  Psychiatric/Behavioral: The patient is not nervous/anxious and does not have insomnia.     No other specific complaints in a complete review of systems (except as listed in HPI above).  Objective  Vitals:   07/17/19 1028  BP: 136/76  Pulse: 64  Resp: 14  Temp: (!) 96.9 F (36.1 C)  SpO2: 98%  Weight: 148 lb 1.6 oz (67.2 kg)  Height: 5\' 5"  (1.651 m)   Body mass index is 24.65 kg/m.  Nursing Note and Vital Signs reviewed.  Physical Exam  Constitutional: Patient appears well-developed and  well-nourished. No distress.  HEENT: head atraumatic, normocephalic,  Cardiovascular: Normal rate, regular rhythm and normal heart sounds.  No murmur heard. No BLE edema. Pulmonary/Chest: Effort normal and breath sounds normal. No respiratory distress. Psychiatric: Patient has a normal mood and affect. behavior is normal. Judgment and thought content normal.    No results found for this or any previous visit (from the past 48 hour(s)).  Assessment & Plan  1. Hypercholesterolemia - atorvastatin (LIPITOR) 20 MG tablet; Take 1 tablet (20 mg total) by mouth daily.  Dispense: 90 tablet; Refill: 1 - Lipid Profile  2. Hypertriglyceridemia - atorvastatin (LIPITOR) 20 MG tablet; Take 1 tablet (20 mg total) by mouth daily.  Dispense: 90 tablet; Refill: 1 - Icosapent Ethyl 1 g CAPS; Take 1 capsule (1 g total) by mouth 2 (two) times daily.  Dispense: 180 capsule; Refill: 1 - Lipid Profile  3. Diabetes mellitus without complication (HCC) - HgB A1c - Urine Microalbumin w/creat. ratio  4. Medication monitoring encounter - COMPLETE METABOLIC PANEL WITH GFR

## 2019-07-18 LAB — COMPLETE METABOLIC PANEL WITH GFR
AG Ratio: 2.1 (calc) (ref 1.0–2.5)
ALT: 36 U/L (ref 9–46)
AST: 23 U/L (ref 10–35)
Albumin: 4.7 g/dL (ref 3.6–5.1)
Alkaline phosphatase (APISO): 64 U/L (ref 35–144)
BUN: 16 mg/dL (ref 7–25)
CO2: 26 mmol/L (ref 20–32)
Calcium: 9.2 mg/dL (ref 8.6–10.3)
Chloride: 107 mmol/L (ref 98–110)
Creat: 0.73 mg/dL (ref 0.70–1.33)
GFR, Est African American: 123 mL/min/{1.73_m2} (ref >=60)
GFR, Est Non African American: 106 mL/min/{1.73_m2} (ref >=60)
Globulin: 2.2 g/dL (calc) (ref 1.9–3.7)
Glucose, Bld: 120 mg/dL — ABNORMAL HIGH (ref 65–99)
Potassium: 4.4 mmol/L (ref 3.5–5.3)
Sodium: 141 mmol/L (ref 135–146)
Total Bilirubin: 1 mg/dL (ref 0.2–1.2)
Total Protein: 6.9 g/dL (ref 6.1–8.1)

## 2019-07-18 LAB — LIPID PANEL
Cholesterol: 132 mg/dL (ref <200)
HDL: 60 mg/dL (ref >=40)
LDL Cholesterol (Calc): 51 mg/dL (calc)
Non-HDL Cholesterol (Calc): 72 mg/dL (calc) (ref <130)
Total CHOL/HDL Ratio: 2.2 (calc) (ref <5.0)
Triglycerides: 131 mg/dL (ref <150)

## 2019-07-18 LAB — MICROALBUMIN / CREATININE URINE RATIO
Creatinine, Urine: 133 mg/dL (ref 20–320)
Microalb Creat Ratio: 3 mcg/mg creat (ref <30)
Microalb, Ur: 0.4 mg/dL

## 2019-07-18 LAB — HEMOGLOBIN A1C
Hgb A1c MFr Bld: 6.8 % of total Hgb — ABNORMAL HIGH (ref <5.7)
Mean Plasma Glucose: 148 (calc)
eAG (mmol/L): 8.2 (calc)

## 2019-11-06 ENCOUNTER — Other Ambulatory Visit: Payer: Self-pay

## 2019-11-06 ENCOUNTER — Ambulatory Visit (INDEPENDENT_AMBULATORY_CARE_PROVIDER_SITE_OTHER): Payer: BLUE CROSS/BLUE SHIELD | Admitting: Family Medicine

## 2019-11-06 ENCOUNTER — Encounter: Payer: Self-pay | Admitting: Family Medicine

## 2019-11-06 VITALS — BP 112/72 | HR 72 | Temp 97.9°F | Resp 14 | Ht 65.0 in | Wt 146.5 lb

## 2019-11-06 DIAGNOSIS — E7800 Pure hypercholesterolemia, unspecified: Secondary | ICD-10-CM | POA: Diagnosis not present

## 2019-11-06 DIAGNOSIS — E119 Type 2 diabetes mellitus without complications: Secondary | ICD-10-CM | POA: Diagnosis not present

## 2019-11-06 DIAGNOSIS — Z5181 Encounter for therapeutic drug level monitoring: Secondary | ICD-10-CM | POA: Diagnosis not present

## 2019-11-06 NOTE — Progress Notes (Signed)
Name: Corey Norman   MRN: [id number]    DOB: 26-Jan-1966   Date:11/06/2019       Progress Note  Chief Complaint  Patient presents with  . Follow-up    not currently on any meds  . Diabetes  . Hyperlipidemia     Subjective:   Corey Norman is a 53 y.o. male, presents to clinic for routine follow up on the conditions listed above.  Patient history is given by patient and by his daughter who is translating for him  Hyperlipidemia:  Current Medication Regimen:  Nothing, a few months ago was on lipitor 20 mg, patient stopping the medication because he believed his cholesterol was too low Last Lipids: Lab Results  Component Value Date   CHOL 132 07/17/2019   HDL 60 07/17/2019   LDLCALC 51 07/17/2019   TRIG 131 07/17/2019   CHOLHDL 2.2 07/17/2019  Labs from 01/2019 LDL 168, HDL 63, total cholesterol 409 - Current Diet:  healthy - Denies: Chest pain, shortness of breath, myalgias. - Documented aortic atherosclerosis? No - Risk factors for atherosclerosis: diabetes mellitus and hypercholesterolemia  Diabetes Mellitus Type II: Currently managing with diet Can't remember his CBG #'s did not bring meter with him, he only checks occasionally Denies: Polyuria, polydipsia, polyphagia, vision changes, or neuropathy  Recent pertinent labs: Lab Results  Component Value Date   HGBA1C 6.8 (H) 07/17/2019   HGBA1C 6.4 (H) 01/02/2019   HGBA1C 6.2 (H) 06/27/2018      Component Value Date/Time   NA 141 07/17/2019 0000   NA 144 12/06/2017 1146   K 4.4 07/17/2019 0000   CL 107 07/17/2019 0000   CO2 26 07/17/2019 0000   GLUCOSE 120 (H) 07/17/2019 0000   BUN 16 07/17/2019 0000   BUN 16 12/06/2017 1146   CREATININE 0.73 07/17/2019 0000   CALCIUM 9.2 07/17/2019 0000   PROT 6.9 07/17/2019 0000   PROT 7.2 12/06/2017 1146   ALBUMIN 4.6 12/06/2017 1146   AST 23 07/17/2019 0000   ALT 36 07/17/2019 0000   ALKPHOS 80 12/06/2017 1146   BILITOT 1.0 07/17/2019 0000   BILITOT 0.6 12/06/2017 1146   GFRNONAA 106 07/17/2019 0000   GFRAA 123 07/17/2019 0000    UTD on DM foot exam and overdue for eye exam ACEI/ARB: No Statin: Yes   Current diet:  Eat a lot of vegetables and less meat, low salt  Sesame oil, and olive oil  Current exercise:  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   Patient Active Problem List   Diagnosis Date Noted  . Diabetes mellitus without complication (HCC) 07/17/2019  . Hypertriglyceridemia 07/17/2019  . Athlete's foot 06/27/2018  . Hx of diabetes mellitus 06/07/2017  . Hypercholesterolemia 06/07/2017  . Vitamin D insufficiency 06/07/2017    History reviewed.  No pertinent surgical history.  Family History  Problem Relation Age of Onset  . Hypertension Mother     Social History   Socioeconomic History  . Marital status: Married    Spouse name: Not on file  . Number of children: Not on file  . Years of education: Not on file  . Highest education level: Not on file  Occupational History  . Not on file  Tobacco Use  . Smoking status: Never Smoker  . Smokeless tobacco: Never Used  Substance and Sexual Activity  . Alcohol use: Yes    Alcohol/week: 0.0 standard drinks    Comment: wine per night  . Drug use: No  . Sexual activity: Yes  Other Topics Concern  . Not on file  Social History Narrative  . Not on file   Social Determinants of Health   Financial Resource Strain:   . Difficulty of Paying Living Expenses: Not on file  Food  Insecurity:   . Worried About Charity fundraiser in the Last Year: Not on file  . Ran Out of Food in the Last Year: Not on file  Transportation Needs:   . Lack of Transportation (Medical): Not on file  . Lack of Transportation (Non-Medical): Not on file  Physical Activity:   . Days of Exercise per Week: Not on file  . Minutes of Exercise per Session: Not on file  Stress:   . Feeling of Stress : Not on file  Social Connections:   . Frequency of Communication with Friends and Family: Not on file  . Frequency of Social Gatherings with Friends and Family: Not on file  . Attends Religious Services: Not on file  . Active Member of Clubs or Organizations: Not on file  . Attends Archivist Meetings: Not on file  . Marital Status: Not on file  Intimate Partner Violence:   . Fear of Current or Ex-Partner: Not on file  . Emotionally Abused: Not on file  . Physically Abused: Not on file  . Sexually Abused: Not on file     Current Outpatient Medications:  .  atorvastatin (LIPITOR) 20 MG tablet, Take 1 tablet (20 mg total) by mouth daily. (Patient not taking: Reported on 11/06/2019), Disp: 90 tablet, Rfl: 1 .  Icosapent Ethyl 1 g CAPS, Take 1 capsule (1 g total) by mouth 2 (two) times daily. (Patient not taking: Reported on 11/06/2019), Disp: 180 capsule, Rfl: 1  No Known Allergies  I personally reviewed active problem list, medication list, allergies, family history, social history, health maintenance, notes from last encounter, lab results, imaging with the patient/caregiver today.  Review of Systems  Constitutional: Negative.   HENT: Negative.   Eyes: Negative.   Respiratory: Negative.   Cardiovascular: Negative.   Gastrointestinal: Negative.   Endocrine: Negative.   Genitourinary: Negative.   Musculoskeletal: Negative.   Skin: Negative.   Allergic/Immunologic: Negative.   Neurological: Negative.   Hematological: Negative.   Psychiatric/Behavioral: Negative.   All  other systems reviewed and are negative.    Objective:    Vitals:   11/06/19 1106  BP: 112/72  Pulse: 72  Resp: 14  Temp: 97.9 F (36.6 C)  SpO2: 100%  Weight: 146 lb 8 oz (66.5 kg)  Height: 5\' 5"  (1.651 m)    Body mass index is 24.38 kg/m.  Physical Exam Vitals and nursing note reviewed.  Constitutional:      General: He is not in acute distress.    Appearance: Normal appearance. He  is well-developed. He is not ill-appearing, toxic-appearing or diaphoretic.     Interventions: Face mask in place.  HENT:     Head: Normocephalic and atraumatic.     Jaw: No trismus.     Right Ear: External ear normal.     Left Ear: External ear normal.  Eyes:     General: Lids are normal. No scleral icterus.    Conjunctiva/sclera: Conjunctivae normal.     Pupils: Pupils are equal, round, and reactive to light.  Neck:     Trachea: Trachea and phonation normal. No tracheal deviation.  Cardiovascular:     Rate and Rhythm: Normal rate and regular rhythm.     Pulses: Normal pulses.          Radial pulses are 2+ on the right side and 2+ on the left side.       Posterior tibial pulses are 2+ on the right side and 2+ on the left side.     Heart sounds: Normal heart sounds. No murmur. No friction rub. No gallop.   Pulmonary:     Effort: Pulmonary effort is normal. No respiratory distress.     Breath sounds: Normal breath sounds. No stridor. No wheezing, rhonchi or rales.  Abdominal:     General: Bowel sounds are normal. There is no distension.     Palpations: Abdomen is soft.     Tenderness: There is no abdominal tenderness. There is no guarding or rebound.  Musculoskeletal:        General: Normal range of motion.     Cervical back: Normal range of motion and neck supple.     Right lower leg: No edema.     Left lower leg: No edema.  Skin:    General: Skin is warm and dry.     Capillary Refill: Capillary refill takes less than 2 seconds.     Coloration: Skin is not jaundiced.      Findings: No rash.     Nails: There is no clubbing.  Neurological:     Mental Status: He is alert.     Cranial Nerves: No dysarthria or facial asymmetry.     Motor: No tremor or abnormal muscle tone.     Gait: Gait normal.  Psychiatric:        Mood and Affect: Mood normal.        Speech: Speech normal.        Behavior: Behavior normal. Behavior is cooperative.      No results found for this or any previous visit (from the past 2160 hour(s)).  Diabetic Foot Exam: PHQ2/9: [facility] 11/06/2019 07/17/2019 01/02/2019 06/27/2018 03/07/2018  Decreased Interest 0 0 0 0 0  Down, Depressed, Hopeless 0 0 0 0 0  PHQ - 2 Score 0 0 0 0 0  Altered sleeping 0 0 0 - -  Tired, decreased energy 0 0 0 - -  Change in appetite 0 0 0 - -  Feeling bad or failure about yourself  0 0 0 - -  Trouble concentrating 0 0 0 - -  Moving slowly or fidgety/restless 0 0 0 - -  Suicidal thoughts 0 0 0 - -  PHQ-9 Score 0 0 0 - -  Difficult doing work/chores Not difficult at all Not difficult at all Not difficult at all - -    phq 9 is negative reviewed  Fall Risk: Fall Risk  11/06/2019 07/17/2019 01/02/2019 06/27/2018 03/07/2018  Falls in the past year? 0 0  0 No No  Number falls in past yr: 0 0 0 - -  Injury with Fall? 0 0 0 - -    Functional Status Survey: Is the patient deaf or have difficulty hearing?: No Does the patient have difficulty seeing, even when wearing glasses/contacts?: No Does the patient have difficulty concentrating, remembering, or making decisions?: No Does the patient have difficulty walking or climbing stairs?: No Does the patient have difficulty dressing or bathing?: No Does the patient have difficulty doing errands alone such as visiting a doctor's office or shopping?: No    Assessment & Plan:    1. Hypercholesterolemia- uncontrolled with med non compliance Pt stopped meds, misunderstood that his levels were too low, explained HLD management and intent to decrease CVD  risk, explained preventative medicine and thoroughly reviewed labs  - Lipid panel  2. Diabetes mellitus without complication (HCC) Diet controlled, recheck labs, pt has been working on diet - HgB A1c  Spent additional time today educating pt re his labs dx and management with meds and lifestyle.   Greater than 50% of this visit was spent in direct face-to-face counseling, obtaining history and physical, discussing and educating pt on treatment plan, face-to-face time > 15 min today.  Total time of this visit was 25.  Remainder of time involved but was not limited to reviewing chart (recent and pertinent OV notes and labs), documentation in EMR, and coordinating care and treatment plan.    Return in about 6 months (around 05/06/2020) for Routine follow-up.   Danelle Berry, PA-C 11/06/19 11:30 AM

## 2019-11-07 LAB — LIPID PANEL
Cholesterol: 199 mg/dL (ref <200)
HDL: 68 mg/dL (ref >=40)
LDL Cholesterol (Calc): 105 mg/dL (calc) — ABNORMAL HIGH
Non-HDL Cholesterol (Calc): 131 mg/dL (calc) — ABNORMAL HIGH (ref <130)
Total CHOL/HDL Ratio: 2.9 (calc) (ref <5.0)
Triglycerides: 151 mg/dL — ABNORMAL HIGH (ref <150)

## 2019-11-07 LAB — COMPLETE METABOLIC PANEL WITH GFR
AG Ratio: 1.7 (calc) (ref 1.0–2.5)
ALT: 22 U/L (ref 9–46)
AST: 17 U/L (ref 10–35)
Albumin: 4.6 g/dL (ref 3.6–5.1)
Alkaline phosphatase (APISO): 67 U/L (ref 35–144)
BUN: 16 mg/dL (ref 7–25)
CO2: 28 mmol/L (ref 20–32)
Calcium: 9.8 mg/dL (ref 8.6–10.3)
Chloride: 105 mmol/L (ref 98–110)
Creat: 0.78 mg/dL (ref 0.70–1.33)
GFR, Est African American: 119 mL/min/{1.73_m2} (ref >=60)
GFR, Est Non African American: 102 mL/min/{1.73_m2} (ref >=60)
Globulin: 2.7 g/dL (calc) (ref 1.9–3.7)
Glucose, Bld: 130 mg/dL — ABNORMAL HIGH (ref 65–99)
Potassium: 4.4 mmol/L (ref 3.5–5.3)
Sodium: 141 mmol/L (ref 135–146)
Total Bilirubin: 0.7 mg/dL (ref 0.2–1.2)
Total Protein: 7.3 g/dL (ref 6.1–8.1)

## 2019-11-07 LAB — HEMOGLOBIN A1C
Hgb A1c MFr Bld: 6.4 % of total Hgb — ABNORMAL HIGH (ref <5.7)
Mean Plasma Glucose: 137 (calc)
eAG (mmol/L): 7.6 (calc)

## 2020-05-06 ENCOUNTER — Other Ambulatory Visit: Payer: Self-pay

## 2020-05-06 ENCOUNTER — Encounter: Payer: Self-pay | Admitting: Family Medicine

## 2020-05-06 ENCOUNTER — Ambulatory Visit (INDEPENDENT_AMBULATORY_CARE_PROVIDER_SITE_OTHER): Payer: BLUE CROSS/BLUE SHIELD | Admitting: Family Medicine

## 2020-05-06 VITALS — BP 120/80 | HR 69 | Temp 97.5°F | Resp 18 | Ht 65.0 in | Wt 142.1 lb

## 2020-05-06 DIAGNOSIS — E7800 Pure hypercholesterolemia, unspecified: Secondary | ICD-10-CM | POA: Diagnosis not present

## 2020-05-06 DIAGNOSIS — E119 Type 2 diabetes mellitus without complications: Secondary | ICD-10-CM | POA: Diagnosis not present

## 2020-05-06 DIAGNOSIS — E781 Pure hyperglyceridemia: Secondary | ICD-10-CM

## 2020-05-06 NOTE — Progress Notes (Signed)
Name: Corey Norman   MRN: [id number]    DOB: May 02, 1966   Date:05/06/2020       Progress Note  Chief Complaint  Patient presents with  . Hyperlipidemia    follow up  . Diabetes     Subjective:   Corey Norman is a 54 y.o. male, presents to clinic for routine follow up on the conditions listed above.  HLD - non-compliant with statins, he has stopped them before, and after last visit with still mildly elevated cholesterol, pt was given refills, but did not take again.  He has worked on diet.  His daughter is here she translates for him and answers majority of questions for him without translating.  She reports that he is not taking meds but treating with his diet, he avoids meat, eats a lot of veggies, "he has good control"   Hx of DM Diabetes Mellitus Type II: Prediabetes/DM, managed with diet changes, he has never been on meds, most recent labs were well controlled Recent pertinent labs: Lab Results  Component Value Date   HGBA1C 6.4 (H) 11/06/2019   HGBA1C 6.8 (H) 07/17/2019   HGBA1C 6.4 (H) 01/02/2019    Pt has declined DM foot exam and eye exam ACEI/ARB: No Statin: No-refuses      Patient Active Problem List   Diagnosis Date Noted  . Diabetes mellitus without complication (HCC) 07/17/2019  . Hypertriglyceridemia 07/17/2019  . Athlete's foot 06/27/2018  . Hx of diabetes mellitus 06/07/2017  . Hypercholesterolemia 06/07/2017  . Vitamin D insufficiency 06/07/2017    No past surgical history on file.  Family History  Problem Relation Age of Onset  . Hypertension Mother     Social History   Tobacco Use  . Smoking status: Never Smoker  . Smokeless tobacco: Never Used  Substance Use Topics  . Alcohol use: Yes    Alcohol/week: 0.0 standard drinks    Comment: wine per night  . Drug use: No      Current Outpatient Medications:  .  atorvastatin (LIPITOR) 20 MG tablet, Take 1 tablet (20 mg total) by mouth daily. (Patient not taking: Reported on 11/06/2019), Disp:  90 tablet, Rfl: 1 .  Icosapent Ethyl 1 g CAPS, Take 1 capsule (1 g total) by mouth 2 (two) times daily. (Patient not taking: Reported on 11/06/2019), Disp: 180 capsule, Rfl: 1  No Known Allergies  Chart Review Today: I personally reviewed active problem list, medication list, allergies, family history, social history, health maintenance, notes from last encounter, lab results, imaging with the patient/caregiver today.   Review of Systems  10 Systems reviewed and are negative for acute change except as noted in the HPI.  Objective:    Vitals:   05/06/20 1058  BP: 120/80  Pulse: 69  Resp: 18  Temp: (!) 97.5 F (36.4 C)  TempSrc: Temporal  SpO2: 99%  Weight: 142 lb 1.6 oz (64.5 kg)  Height: 5\' 5"  (1.651 m)    Body mass index is 23.65 kg/m.  Physical Exam Vitals and nursing note reviewed.  Constitutional:      General: He is not in acute distress.    Appearance: Normal appearance. He is normal weight. He is not ill-appearing, toxic-appearing or diaphoretic.  HENT:     Head: Normocephalic and atraumatic.     Right Ear: External ear normal.     Left Ear: External ear normal.  Eyes:     General: No scleral icterus. Cardiovascular:     Rate and Rhythm:  Normal rate and regular rhythm.     Pulses: Normal pulses.     Heart sounds: Normal heart sounds. No murmur heard.  No friction rub. No gallop.   Pulmonary:     Effort: Pulmonary effort is normal. No respiratory distress.     Breath sounds: Normal breath sounds. No stridor. No wheezing or rales.  Abdominal:     General: Bowel sounds are normal.     Palpations: Abdomen is soft.  Musculoskeletal:     Right lower leg: No edema.     Left lower leg: No edema.  Skin:    General: Skin is warm and dry.     Coloration: Skin is not jaundiced or pale.  Neurological:     Mental Status: He is alert. Mental status is at baseline.  Psychiatric:        Mood and Affect: Mood normal.        Behavior: Behavior normal.        PHQ2/9: Depression screen [facility] 2/9 05/06/2020 11/06/2019 07/17/2019 01/02/2019 06/27/2018  Decreased Interest 0 0 0 0 0  Down, Depressed, Hopeless 0 0 0 0 0  PHQ - 2 Score 0 0 0 0 0  Altered sleeping 0 0 0 0 -  Tired, decreased energy 0 0 0 0 -  Change in appetite 0 0 0 0 -  Feeling bad or failure about yourself  0 0 0 0 -  Trouble concentrating 0 0 0 0 -  Moving slowly or fidgety/restless 0 0 0 0 -  Suicidal thoughts 0 0 0 0 -  PHQ-9 Score 0 0 0 0 -  Difficult doing work/chores Not difficult at all Not difficult at all Not difficult at all Not difficult at all -    phq 9 is negative, reviewed  Fall Risk: Fall Risk  05/06/2020 11/06/2019 07/17/2019 01/02/2019 06/27/2018  Falls in the past year? 0 0 0 0 No  Number falls in past yr: 0 0 0 0 -  Injury with Fall? 0 0 0 0 -    Functional Status Survey: Is the patient deaf or have difficulty hearing?: No Does the patient have difficulty seeing, even when wearing glasses/contacts?: No Does the patient have difficulty concentrating, remembering, or making decisions?: No Does the patient have difficulty walking or climbing stairs?: No Does the patient have difficulty dressing or bathing?: No Does the patient have difficulty doing errands alone such as visiting a doctor's office or shopping?: No   Assessment & Plan:   1. Diabetes mellitus without complication (HCC) Patient has gone from prediabetes to T2DM range over the past 1 to 2 years has never been on medications but has managed with diet and lifestyle, last A1c was about 6 months ago it was well controlled, 6.4.  He is not very interested in medications he is not checking blood sugars nor has he been encouraged to check regularly.  We will recheck labs today.  Discussed diabetic care with the patient and his daughter who is translating for him explained that with the type 2 diabetes diagnosis we will want to check his feet eyesight, urine for protein and kidney damage, and blood work at  least once a year. If A1c is under 6.5 again I do plan to change his diagnosis to prediabetes and monitor annually - COMPLETE METABOLIC PANEL WITH GFR - Lipid panel - Hemoglobin A1c  2. Hypercholesterolemia Patient has presented for a second time in the past 6 months for cholesterol but has not taking the  statin medication.  Explained again that with diabetes standard of care and preventative for him to take a statin medication, his last lipid panel did show elevated LDL and triglycerides at that time even though he still reported the same diet with mostly vegetables.  We will recheck today, took his meds off of his list -due to patient preference - COMPLETE METABOLIC PANEL WITH GFR - Lipid panel  3. Hypertriglyceridemia Not on meds, reports is still working on diet - Lipid panel   Return for 6 month f/up HLD/CPE.   Greater than 50% of this visit was spent in direct face-to-face counseling, obtaining history and physical, discussing and educating pt on treatment plan.  Total time of this visit was 30 min +.  More time spent educating patient about diagnoses, standard of care and treatment, additional time with translator/interpreter.  Remainder of time involved but was not limited to reviewing chart (recent and pertinent OV notes and labs), documentation in EMR, and coordinating care and treatment plan.    Delsa Grana, PA-C 05/06/20 11:14 AM

## 2020-05-06 NOTE — Patient Instructions (Signed)
We will call you with the results of labs and directions for which medications we recommend

## 2020-05-07 LAB — HEMOGLOBIN A1C
Hgb A1c MFr Bld: 6.2 % of total Hgb — ABNORMAL HIGH (ref <5.7)
Mean Plasma Glucose: 131 (calc)
eAG (mmol/L): 7.3 (calc)

## 2020-05-07 LAB — COMPLETE METABOLIC PANEL WITH GFR
AG Ratio: 2 (calc) (ref 1.0–2.5)
ALT: 21 U/L (ref 9–46)
AST: 16 U/L (ref 10–35)
Albumin: 4.6 g/dL (ref 3.6–5.1)
Alkaline phosphatase (APISO): 68 U/L (ref 35–144)
BUN: 19 mg/dL (ref 7–25)
CO2: 27 mmol/L (ref 20–32)
Calcium: 9.3 mg/dL (ref 8.6–10.3)
Chloride: 106 mmol/L (ref 98–110)
Creat: 0.76 mg/dL (ref 0.70–1.33)
GFR, Est African American: 120 mL/min/{1.73_m2} (ref >=60)
GFR, Est Non African American: 103 mL/min/{1.73_m2} (ref >=60)
Globulin: 2.3 g/dL (calc) (ref 1.9–3.7)
Glucose, Bld: 117 mg/dL — ABNORMAL HIGH (ref 65–99)
Potassium: 4.3 mmol/L (ref 3.5–5.3)
Sodium: 141 mmol/L (ref 135–146)
Total Bilirubin: 0.9 mg/dL (ref 0.2–1.2)
Total Protein: 6.9 g/dL (ref 6.1–8.1)

## 2020-05-07 LAB — LIPID PANEL
Cholesterol: 170 mg/dL (ref <200)
HDL: 63 mg/dL (ref >=40)
LDL Cholesterol (Calc): 90 mg/dL (calc)
Non-HDL Cholesterol (Calc): 107 mg/dL (calc) (ref <130)
Total CHOL/HDL Ratio: 2.7 (calc) (ref <5.0)
Triglycerides: 82 mg/dL (ref <150)

## 2020-05-08 ENCOUNTER — Encounter: Payer: Self-pay | Admitting: Family Medicine

## 2020-11-11 ENCOUNTER — Encounter: Payer: BLUE CROSS/BLUE SHIELD | Admitting: Family Medicine

## 2021-01-08 ENCOUNTER — Encounter: Payer: Self-pay | Admitting: Family Medicine

## 2021-02-10 ENCOUNTER — Encounter: Payer: Self-pay | Admitting: Family Medicine

## 2021-02-10 ENCOUNTER — Ambulatory Visit (INDEPENDENT_AMBULATORY_CARE_PROVIDER_SITE_OTHER): Payer: Self-pay | Admitting: Family Medicine

## 2021-02-10 ENCOUNTER — Other Ambulatory Visit: Payer: Self-pay

## 2021-02-10 VITALS — BP 122/76 | HR 69 | Temp 97.8°F | Resp 18 | Ht 65.0 in | Wt 151.8 lb

## 2021-02-10 DIAGNOSIS — R002 Palpitations: Secondary | ICD-10-CM

## 2021-02-10 DIAGNOSIS — E7800 Pure hypercholesterolemia, unspecified: Secondary | ICD-10-CM

## 2021-02-10 DIAGNOSIS — Z5181 Encounter for therapeutic drug level monitoring: Secondary | ICD-10-CM

## 2021-02-10 DIAGNOSIS — E781 Pure hyperglyceridemia: Secondary | ICD-10-CM

## 2021-02-10 DIAGNOSIS — E119 Type 2 diabetes mellitus without complications: Secondary | ICD-10-CM | POA: Insufficient documentation

## 2021-02-10 NOTE — Progress Notes (Signed)
Name: Corey Norman   MRN: [id number]    DOB: 02/11/1966   Date:02/10/2021       Progress Note  Chief Complaint  Patient presents with  . Diabetes    Follow up     Subjective:   Corey Norman is a 55 y.o. male, presents to clinic for DM f/up  DM:   Pt managing DM with diet/lifestyle No meds Denies: Polyuria, polydipsia, vision changes, neuropathy, hypoglycemia Recent pertinent labs: Lab Results  Component Value Date   HGBA1C 6.2 (H) 05/06/2020   HGBA1C 6.4 (H) 11/06/2019   HGBA1C 6.8 (H) 07/17/2019   Standard of care and health maintenance: Urine Microalbumin:  Due Foot exam:  Done today DM eye exam:  refused ACEI/ARB:  Not taking Statin:  No stopped med  Hyperlipidemia: Was on statin and other supplements but stopped them Last Lipids: Lab Results  Component Value Date   CHOL 170 05/06/2020   HDL 63 05/06/2020   LDLCALC 90 05/06/2020   TRIG 82 05/06/2020   CHOLHDL 2.7 05/06/2020   - Denies: Chest pain, shortness of breath, myalgias, claudication  He sometimes experiences palpitations especially if he is eating spicy food he denies any chest pain, near syncopal episodes, swelling in his legs  He has declined his labs today and most of his preventative medicine and gaps in his health maintenance-he did labs recently for life insurance and would like to bring those results in prior to doing more lab work today  No current outpatient medications on file.  Patient Active Problem List   Diagnosis Date Noted  . Diabetes mellitus without complication (HCC) 02/10/2021  . Hypertriglyceridemia 07/17/2019  . Hypercholesterolemia 06/07/2017  . Vitamin D insufficiency 06/07/2017    No past surgical history on file.  Family History  Problem Relation Age of Onset  . Hypertension Mother     Social History   Tobacco Use  . Smoking status: Never Smoker  . Smokeless tobacco: Never Used  Substance Use Topics  . Alcohol use: Yes    Alcohol/week: 0.0 standard drinks     Comment: wine per night  . Drug use: No     No Known Allergies  Health Maintenance  Topic Date Due  . URINE MICROALBUMIN  07/16/2020  . HEMOGLOBIN A1C  11/05/2020  . OPHTHALMOLOGY EXAM  02/10/2021 (Originally 08/29/1975)  . INFLUENZA VACCINE  03/29/2021 (Originally 06/22/2020)  . PNEUMOCOCCAL POLYSACCHARIDE VACCINE AGE 75-64 HIGH RISK  05/06/2021 (Originally 08/29/1967)  . TETANUS/TDAP  05/06/2021 (Originally 08/28/1984)  . Hepatitis C Screening  05/06/2021 (Originally 1966-01-10)  . HIV Screening  05/06/2021 (Originally 08/28/1980)  . FOOT EXAM  02/10/2022  . COLONOSCOPY (Pts 45-49yrs Insurance coverage will need to be confirmed)  09/13/2022  . COVID-19 Vaccine  Completed  . HPV VACCINES  Aged Out    Chart Review Today: I personally reviewed active problem list, medication list, allergies, family history, social history, health maintenance, notes from last encounter, lab results, imaging with the patient/caregiver today.    Review of Systems  Constitutional: Negative.  Negative for activity change, appetite change, fatigue and unexpected weight change.  HENT: Negative.   Eyes: Negative.   Respiratory: Negative.  Negative for shortness of breath.   Cardiovascular: Negative.  Negative for chest pain, palpitations and leg swelling.  Gastrointestinal: Negative.  Negative for abdominal pain and blood in stool.  Endocrine: Negative.   Genitourinary: Negative.  Negative for decreased urine volume, difficulty urinating, testicular pain and urgency.  Skin: Negative.  Negative for  color change and pallor.  Allergic/Immunologic: Negative.   Neurological: Negative.  Negative for syncope, weakness, light-headedness and numbness.  Psychiatric/Behavioral: Negative.  Negative for confusion, dysphoric mood, self-injury and suicidal ideas. The patient is not nervous/anxious.   All other systems reviewed and are negative.      Objective:   Vitals:   02/10/21 1004  BP: 122/76  Pulse: 69   Resp: 18  Temp: 97.8 F (36.6 C)  TempSrc: Oral  SpO2: 99%  Weight: 151 lb 12.8 oz (68.9 kg)  Height: 5\' 5"  (1.651 m)    Body mass index is 25.26 kg/m.  Physical Exam Vitals and nursing note reviewed.  Constitutional:      General: He is not in acute distress.    Appearance: Normal appearance. He is well-developed. He is not ill-appearing, toxic-appearing or diaphoretic.     Interventions: Face mask in place.  HENT:     Head: Normocephalic and atraumatic.     Jaw: No trismus.     Right Ear: External ear normal.     Left Ear: External ear normal.  Eyes:     General: Lids are normal. No scleral icterus.       Right eye: No discharge.        Left eye: No discharge.     Conjunctiva/sclera: Conjunctivae normal.  Neck:     Trachea: Trachea and phonation normal. No tracheal deviation.  Cardiovascular:     Rate and Rhythm: Normal rate and regular rhythm.     Pulses: Normal pulses.          Radial pulses are 2+ on the right side and 2+ on the left side.       Posterior tibial pulses are 2+ on the right side and 2+ on the left side.     Heart sounds: Normal heart sounds. No murmur heard. No friction rub. No gallop.   Pulmonary:     Effort: Pulmonary effort is normal. No respiratory distress.     Breath sounds: Normal breath sounds. No stridor. No wheezing, rhonchi or rales.  Abdominal:     General: Bowel sounds are normal. There is no distension.     Palpations: Abdomen is soft.  Musculoskeletal:     Right lower leg: No edema.     Left lower leg: No edema.  Skin:    General: Skin is warm and dry.     Coloration: Skin is not jaundiced.     Findings: No rash.     Nails: There is no clubbing.  Neurological:     Mental Status: He is alert. Mental status is at baseline.     Cranial Nerves: No dysarthria or facial asymmetry.     Motor: No tremor or abnormal muscle tone.     Gait: Gait normal.  Psychiatric:        Mood and Affect: Mood normal.        Speech: Speech normal.         Behavior: Behavior normal. Behavior is cooperative.     Diabetic Foot Exam - Simple   Simple Foot Form Diabetic Foot exam was performed with the following findings: Yes 02/10/2021 10:53 AM  Visual Inspection No deformities, no ulcerations, no other skin breakdown bilaterally: Yes Sensation Testing Intact to touch and monofilament testing bilaterally: Yes Pulse Check Posterior Tibialis and Dorsalis pulse intact bilaterally: Yes Comments Several long thickened toenails, none ingrown, no erythema or wounds       Assessment & Plan:  ICD-10-CM   1. Diabetes mellitus without complication (HCC)  E11.9 Microalbumin, urine    Hemoglobin A1C    COMPLETE METABOLIC PANEL WITH GFR    Lipid panel   Well controlled in the past with only diet and lifestyle efforts, foot exam done today, due for microalbumin and A1c, does need to be on statin and ACEI/ARB  2. Hypercholesterolemia  E78.00 COMPLETE METABOLIC PANEL WITH GFR    Lipid panel   Patient stopped her medications, explained the benefit of being on medicines with his diagnosis of HLD and T2DM  3. Hypertriglyceridemia  E78.1    recheck - continue diet/lifestyle efforts  4. Palpitations  R00.2    intermittent, none currently, asked pt to come in for visit for acute complaint, discussed things than can affect HR - including stress/pain/spicy food  5. Medication monitoring encounter  Z51.81 Microalbumin, urine    Hemoglobin A1C    COMPLETE METABOLIC PANEL WITH GFR    Lipid panel   Pt did not have any red flags and current sx with his mention of palpitations today - discussed work up and possible testing that could be done - invited him to come back when sx occur so we could eval/work up  Return in about 6 months (around 08/13/2021) for Routine follow-up DM, HLD CPE.   Danelle Berry, PA-C 02/10/21 3:04 PM

## 2021-02-10 NOTE — Patient Instructions (Addendum)
Health Maintenance  Topic Date Due  . Urine Protein Check  07/16/2020  . Hemoglobin A1C  11/05/2020  . Eye exam for diabetics  02/10/2021*  . Flu Shot  03/29/2021*  . Complete foot exam   04/29/2021*  . Pneumococcal vaccine  05/06/2021*  . Tetanus Vaccine  05/06/2021*  .  Hepatitis C: One time screening is recommended by Center for Disease Control  (CDC) for  adults born from [birth year] through 1966.   05/06/2021*  . HIV Screening  05/06/2021*  . Colon Cancer Screening  09/13/2022  . COVID-19 Vaccine  Completed  . HPV Vaccine  Aged Out  *Topic was postponed. The date shown is not the original due date.   Bring in your labs that were recently done.  I can review them and get anything needed ordered and done at that point.   Diabetes Mellitus and Standards of Medical Care Living with and managing diabetes (diabetes mellitus) can be complicated. Your diabetes treatment may be managed by a team of health care providers, including:  A physician who specializes in diabetes (endocrinologist). You might also have visits with a nurse practitioner or physician assistant.  Nurses.  A registered dietitian.  A certified diabetes care and education specialist.  An exercise specialist.  A pharmacist.  An eye doctor.  A foot specialist (podiatrist).  A dental care provider.  A primary care provider.  A mental health care provider. How to manage your diabetes You can do many things to successfully manage your diabetes. Your health care providers will follow guidelines to help you get the best quality of care. Here are general guidelines for your diabetes management plan. Your health care providers may give you more specific instructions. Physical exams When you are diagnosed with diabetes, and each year after that, your health care provider will ask about your medical and family history. You will have a physical exam, which may include:  Measuring your height, weight, and body mass index  (BMI).  Checking your blood pressure. This will be done at every routine medical visit. Your target blood pressure may vary depending on your medical conditions, your age, and other factors.  A thyroid exam.  A skin exam.  Screening for nerve damage (peripheral neuropathy). This may include checking the pulse in your legs and feet and the level of sensation in your hands and feet.  A foot exam to inspect the structure and skin of your feet, including checking for cuts, bruises, redness, blisters, sores, or other problems.  Screening for blood vessel (vascular) problems. This may include checking the pulse in your legs and feet and checking your temperature. Blood tests Depending on your treatment plan and your personal needs, you may have the following tests:  Hemoglobin A1C (HbA1C). This test provides information about blood sugar (glucose) control over the previous 2-3 months. It is used to adjust your treatment plan, if needed. This test will be done: ? At least 2 times a year, if you are meeting your treatment goals. ? 4 times a year, if you are not meeting your treatment goals or if your goals have changed.  Lipid testing, including total cholesterol, LDL and HDL cholesterol, and triglyceride levels. ? The goal for LDL is less than 100 mg/dL (5.5 mmol/L). If you are at high risk for complications, the goal is less than 70 mg/dL (3.9 mmol/L). ? The goal for HDL is 40 mg/dL (2.2 mmol/L) or higher for men, and 50 mg/dL (2.8 mmol/L) or higher for women. An  HDL cholesterol of 60 mg/dL (3.3 mmol/L) or higher gives some protection against heart disease. ? The goal for triglycerides is less than 150 mg/dL (8.3 mmol/L).  Liver function tests.  Kidney function tests.  Thyroid function tests.   Dental and eye exams  Visit your dentist two times a year.  If you have type 1 diabetes, your health care provider may recommend an eye exam within 5 years after you are diagnosed, and then once a  year after your first exam. ? For children with type 1 diabetes, the health care provider may recommend an eye exam when your child is age 67 or older and has had diabetes for 3-5 years. After the first exam, your child should get an eye exam once a year.  If you have type 2 diabetes, your health care provider may recommend an eye exam as soon as you are diagnosed, and then every 1-2 years after your first exam.   Immunizations  A yearly flu (influenza) vaccine is recommended annually for everyone 6 months or older. This is especially important if you have diabetes.  The pneumonia (pneumococcal) vaccine is recommended for everyone 2 years or older who has diabetes. If you are age 80 or older, you may get the pneumonia vaccine as a series of two separate shots.  The hepatitis B vaccine is recommended for adults shortly after being diagnosed with diabetes. Adults and children with diabetes should receive all other vaccines according to age-specific recommendations from the Centers for Disease Control and Prevention (CDC). Mental and emotional health Screening for symptoms of eating disorders, anxiety, and depression is recommended at the time of diagnosis and after as needed. If your screening shows that you have symptoms, you may need more evaluation. You may work with a mental health care provider. Follow these instructions at home: Treatment plan You will monitor your blood glucose levels and may give yourself insulin. Your treatment plan will be reviewed at every medical visit. You and your health care provider will discuss:  How you are taking your medicines, including insulin.  Any side effects you have.  Your blood glucose level target goals.  How often you monitor your blood glucose level.  Lifestyle habits, such as activity level and tobacco, alcohol, and substance use. Education Your health care provider will assess how well you are monitoring your blood glucose levels and whether  you are taking your insulin and medicines correctly. He or she may refer you to:  A certified diabetes care and education specialist to manage your diabetes throughout your life, starting at diagnosis.  A registered dietitian who can create and review your personal nutrition plan.  An exercise specialist who can discuss your activity level and exercise plan. General instructions  Take over-the-counter and prescription medicines only as told by your health care provider.  Keep all follow-up visits. This is important. Where to find support There are many diabetes support networks, including:  American Diabetes Association (ADA): diabetes.org  Defeat Diabetes Foundation: defeatdiabetes.org Where to find more information  American Diabetes Association (ADA): www.diabetes.org  Association of Diabetes Care & Education Specialists (ADCES): diabeteseducator.org  International Diabetes Federation (IDF): http://hill.biz/ Summary  Managing diabetes (diabetes mellitus) can be complicated. Your diabetes treatment may be managed by a team of health care providers.  Your health care providers follow guidelines to help you get the best quality care.  You should have physical exams, blood tests, blood pressure monitoring, immunizations, and screening tests regularly. Stay updated on how to manage your  diabetes.  Your health care providers may also give you more specific instructions based on your individual health. This information is not intended to replace advice given to you by your health care provider. Make sure you discuss any questions you have with your health care provider. Document Revised: 05/15/2020 Document Reviewed: 05/15/2020 Elsevier Patient Education  2021 [employer].

## 2021-07-07 ENCOUNTER — Encounter: Payer: Self-pay | Admitting: Family Medicine

## 2021-07-21 ENCOUNTER — Encounter: Payer: Self-pay | Admitting: Family Medicine

## 2021-07-21 ENCOUNTER — Other Ambulatory Visit: Payer: Self-pay

## 2021-07-21 ENCOUNTER — Ambulatory Visit (INDEPENDENT_AMBULATORY_CARE_PROVIDER_SITE_OTHER): Payer: 59 | Admitting: Family Medicine

## 2021-07-21 VITALS — BP 118/82 | HR 69 | Temp 98.3°F | Ht 65.0 in | Wt 147.4 lb

## 2021-07-21 DIAGNOSIS — E7800 Pure hypercholesterolemia, unspecified: Secondary | ICD-10-CM

## 2021-07-21 DIAGNOSIS — Z1159 Encounter for screening for other viral diseases: Secondary | ICD-10-CM

## 2021-07-21 DIAGNOSIS — Z0000 Encounter for general adult medical examination without abnormal findings: Secondary | ICD-10-CM | POA: Diagnosis not present

## 2021-07-21 DIAGNOSIS — Z114 Encounter for screening for human immunodeficiency virus [HIV]: Secondary | ICD-10-CM | POA: Diagnosis not present

## 2021-07-21 DIAGNOSIS — E781 Pure hyperglyceridemia: Secondary | ICD-10-CM

## 2021-07-21 DIAGNOSIS — E119 Type 2 diabetes mellitus without complications: Secondary | ICD-10-CM | POA: Diagnosis not present

## 2021-07-21 NOTE — Assessment & Plan Note (Signed)
Doing well, recheck labs today.

## 2021-07-21 NOTE — Assessment & Plan Note (Signed)
Recheck labs 

## 2021-07-21 NOTE — Progress Notes (Signed)
BP 118/82   Pulse 69   Temp 98.3 F (36.8 C)   Ht 5\' 5"  (1.651 m)   Wt 147 lb 6.4 oz (66.9 kg)   SpO2 99%   BMI 24.53 kg/m    Subjective:    Patient ID: , male    DOB: Dec 26, 1965, 55 y.o.   MRN: [id number]  HPI: Corey Norman is a 55 y.o. male presenting on 07/21/2021 for comprehensive medical examination. Current medical complaints include:none  He currently lives with: wife, daughter. Interim Problems from his last visit: no  Depression Screen done today and results listed below:  Depression screen [facility] 2/9 07/21/2021 02/10/2021 05/06/2020 11/06/2019 07/17/2019  Decreased Interest 0 0 0 0 0  Down, Depressed, Hopeless 0 0 0 0 0  PHQ - 2 Score 0 0 0 0 0  Altered sleeping 0 - 0 0 0  Tired, decreased energy 0 - 0 0 0  Change in appetite 0 - 0 0 0  Feeling bad or failure about yourself  0 - 0 0 0  Trouble concentrating 0 - 0 0 0  Moving slowly or fidgety/restless 0 - 0 0 0  Suicidal thoughts 0 - 0 0 0  PHQ-9 Score 0 - 0 0 0  Difficult doing work/chores Not difficult at all - Not difficult at all Not difficult at all Not difficult at all    The patient does not have a history of falls. I did not complete a risk assessment for falls. A plan of care for falls was not documented.   Past Medical History:  Past Medical History:  Diagnosis Date   Diabetes mellitus without complication (HCC)    Last A1c was normal at 5.8%, not on medication.    Surgical History:  History reviewed. No pertinent surgical history.  Medications:  No current outpatient medications on file prior to visit.   No current facility-administered medications on file prior to visit.    Allergies:  No Known Allergies  Social History:  Social History   Socioeconomic History   Marital status: Married    Spouse name: Not on file   Number of children: Not on file   Years of education: Not on file   Highest education level: Not on file  Occupational History   Not on file  Tobacco Use   Smoking  status: Never   Smokeless tobacco: Never  Substance and Sexual Activity   Alcohol use: Yes    Alcohol/week: 0.0 standard drinks    Comment: wine per night   Drug use: No   Sexual activity: Yes  Other Topics Concern   Not on file  Social History Narrative   ** Merged History Encounter **       Social Determinants of Health   Financial Resource Strain: Low Risk    Difficulty of Paying Living Expenses: Not hard at all  Food Insecurity: No Food Insecurity   Worried About 07/19/2019 in the Last Year: Never true   Ran Out of Food in the Last Year: Never true  Transportation Needs: No Transportation Needs   Lack of Transportation (Medical): No   Lack of Transportation (Non-Medical): No  Physical Activity: Inactive   Days of Exercise per Week: 0 days   Minutes of Exercise per Session: 0 min  Stress: No Stress Concern Present   Feeling of Stress : Not at all  Social Connections: Unknown   Frequency of Communication with Friends and Family: More than three times  a week   Frequency of Social Gatherings with Friends and Family: Three times a week   Attends Religious Services: Patient refused   Active Member of Clubs or Organizations: No   Attends Banker Meetings: Never   Marital Status: Married  Catering manager Violence: Not At Risk   Fear of Current or Ex-Partner: No   Emotionally Abused: No   Physically Abused: No   Sexually Abused: No   Social History   Tobacco Use  Smoking Status Never  Smokeless Tobacco Never   Social History   Substance and Sexual Activity  Alcohol Use Yes   Alcohol/week: 0.0 standard drinks   Comment: wine per night    Family History:  Family History  Problem Relation Age of Onset   Hypertension Mother     Past medical history, surgical history, medications, allergies, family history and social history reviewed with patient today and changes made to appropriate areas of the chart.      Objective:    BP 118/82    Pulse 69   Temp 98.3 F (36.8 C)   Ht 5\' 5"  (1.651 m)   Wt 147 lb 6.4 oz (66.9 kg)   SpO2 99%   BMI 24.53 kg/m   Wt Readings from Last 3 Encounters:  07/21/21 147 lb 6.4 oz (66.9 kg)  02/10/21 151 lb 12.8 oz (68.9 kg)  05/06/20 142 lb 1.6 oz (64.5 kg)    Physical Exam Vitals reviewed.  Constitutional:      General: He is not in acute distress.    Appearance: Normal appearance. He is normal weight.  HENT:     Head: Normocephalic.     Right Ear: External ear normal.     Left Ear: External ear normal.  Eyes:     Extraocular Movements: Extraocular movements intact.  Cardiovascular:     Rate and Rhythm: Normal rate and regular rhythm.     Heart sounds: Normal heart sounds. No murmur heard. Pulmonary:     Effort: Pulmonary effort is normal. No respiratory distress.     Breath sounds: Normal breath sounds.  Abdominal:     General: Bowel sounds are normal. There is no distension.     Palpations: Abdomen is soft.     Tenderness: There is no abdominal tenderness.  Musculoskeletal:        General: Normal range of motion.     Cervical back: Normal range of motion.     Right lower leg: No edema.     Left lower leg: No edema.  Skin:    General: Skin is warm and dry.  Neurological:     Mental Status: He is alert and oriented to person, place, and time. Mental status is at baseline.     Gait: Gait normal.  Psychiatric:        Mood and Affect: Mood normal.        Behavior: Behavior normal.    Results for orders placed or performed in visit on 05/06/20  COMPLETE METABOLIC PANEL WITH GFR  Result Value Ref Range   Glucose, Bld 117 (H) 65 - 99 mg/dL   BUN 19 7 - 25 mg/dL   Creat 05/08/20 7.42 - 5.95 mg/dL   GFR, Est Non African American 103 > OR = 60 mL/min/1.1m2   GFR, Est African American 120 > OR = 60 mL/min/1.54m2   BUN/Creatinine Ratio NOT APPLICABLE 6 - 22 (calc)   Sodium 141 135 - 146 mmol/L   Potassium 4.3 3.5 - 5.3  mmol/L   Chloride 106 98 - 110 mmol/L   CO2 27 20 - 32  mmol/L   Calcium 9.3 8.6 - 10.3 mg/dL   Total Protein 6.9 6.1 - 8.1 g/dL   Albumin 4.6 3.6 - 5.1 g/dL   Globulin 2.3 1.9 - 3.7 g/dL (calc)   AG Ratio 2.0 1.0 - 2.5 (calc)   Total Bilirubin 0.9 0.2 - 1.2 mg/dL   Alkaline phosphatase (APISO) 68 35 - 144 U/L   AST 16 10 - 35 U/L   ALT 21 9 - 46 U/L  Lipid panel  Result Value Ref Range   Cholesterol 170 <200 mg/dL   HDL 63 > OR = 40 mg/dL   Triglycerides 82 <960<150 mg/dL   LDL Cholesterol (Calc) 90 mg/dL (calc)   Total CHOL/HDL Ratio 2.7 <5.0 (calc)   Non-HDL Cholesterol (Calc) 107 <130 mg/dL (calc)  Hemoglobin A5WA1c  Result Value Ref Range   Hgb A1c MFr Bld 6.2 (H) <5.7 % of total Hgb   Mean Plasma Glucose 131 (calc)   eAG (mmol/L) 7.3 (calc)      Assessment & Plan:   Problem List Items Addressed This Visit       Endocrine   Diabetes mellitus without complication (HCC)    Doing well, recheck labs today.      Relevant Orders   Urine Microalbumin w/creat. ratio   Comprehensive metabolic panel   Lipid panel   COMPLETE METABOLIC PANEL WITH GFR   Microalbumin, urine   Hemoglobin A1C     Other   Hypercholesterolemia    Recheck labs today.      Hypertriglyceridemia    Recheck labs.      Other Visit Diagnoses     Screening for HIV (human immunodeficiency virus)    -  Primary   Relevant Orders   HIV antibody (with reflex)   Need for hepatitis C screening test       Relevant Orders   Hepatitis C Antibody        LABORATORY TESTING:  Health maintenance labs ordered today as discussed above.   The natural history of prostate cancer and ongoing controversy regarding screening and potential treatment outcomes of prostate cancer has been discussed with the patient. The meaning of a false positive PSA and a false negative PSA has been discussed. He indicates understanding of the limitations of this screening test and wishes not to proceed with screening PSA testing.   IMMUNIZATIONS:   - Tdap: Tetanus vaccination status  reviewed: due, refused. . - Influenza: Refused - Pneumococcal: Refused - HPV: Not applicable - Shingrix vaccine: Refused - COVID vaccine: 3 doses of mRNA vaccine  SCREENING: - Colonoscopy: Up to date  Discussed with patient purpose of the colonoscopy is to detect colon cancer at curable precancerous or early stages   - AAA Screening: Not applicable  - Lung cancer screening: Not applicable   PATIENT COUNSELING:    Sexuality: Discussed sexually transmitted diseases, partner selection, use of condoms, avoidance of unintended pregnancy  and contraceptive alternatives.   Advised to avoid cigarette smoking.  I discussed with the patient that most people either abstain from alcohol or drink within safe limits (<=14/week and <=4 drinks/occasion for males, <=7/weeks and <= 3 drinks/occasion for females) and that the risk for alcohol disorders and other health effects rises proportionally with the number of drinks per week and how often a drinker exceeds daily limits.  Discussed cessation/primary prevention of drug use and availability of treatment for abuse.  Diet: Encouraged to adjust caloric intake to maintain  or achieve ideal body weight, to reduce intake of dietary saturated fat and total fat, to limit sodium intake by avoiding high sodium foods and not adding table salt, and to maintain adequate dietary potassium and calcium preferably from fresh fruits, vegetables, and low-fat dairy products.    Stressed the importance of regular exercise. No formal exercise.   Injury prevention: Discussed safety belts, safety helmets, smoke detector, smoking near bedding or upholstery.   Dental health: Discussed importance of regular tooth brushing, flossing, and dental visits.   Follow up plan: NEXT PREVENTATIVE PHYSICAL DUE IN 1 YEAR. Return in about 6 months (around 01/19/2022) for diabetes.

## 2021-07-21 NOTE — Patient Instructions (Signed)
It was great to see you!  Our plans for today:  - I recommend getting the pneumonia, shingles, and flu vaccines. You can get these from your pharmacy.  - We are checking some labs today, we will release these results to your MyChart.  Take care and seek immediate care sooner if you develop any concerns.   Dr. Linwood Dibbles  Things to do to keep yourself healthy  - Exercise at least 30-45 minutes a day, 3-4 days a week.  - Eat a low-fat diet with lots of fruits and vegetables, up to 7-9 servings per day.  - Seatbelts can save your life. Wear them always.  - Smoke detectors on every level of your home, check batteries every year.  - Eye Doctor - have an eye exam every 1-2 years  - Safe sex - if you may be exposed to STDs, use a condom.  - Alcohol -  If you drink, do it moderately, less than 2 drinks per day.  - Health Care Power of Attorney. Choose someone to speak for you if you are not able. https://www.prepareforyourcare.org is a great website to help you navigate this. - Depression is common in our stressful world.If you're feeling down or losing interest in things you normally enjoy, please come in for a visit.  - Violence - If anyone is threatening or hurting you, please call immediately.

## 2021-07-21 NOTE — Assessment & Plan Note (Signed)
Recheck labs today. 

## 2021-07-22 LAB — COMPLETE METABOLIC PANEL WITH GFR
AG Ratio: 1.6 (calc) (ref 1.0–2.5)
ALT: 33 U/L (ref 9–46)
AST: 25 U/L (ref 10–35)
Albumin: 4.5 g/dL (ref 3.6–5.1)
Alkaline phosphatase (APISO): 69 U/L (ref 35–144)
BUN: 19 mg/dL (ref 7–25)
CO2: 24 mmol/L (ref 20–32)
Calcium: 9.3 mg/dL (ref 8.6–10.3)
Chloride: 106 mmol/L (ref 98–110)
Creat: 0.7 mg/dL (ref 0.70–1.30)
Globulin: 2.9 g/dL (calc) (ref 1.9–3.7)
Glucose, Bld: 127 mg/dL — ABNORMAL HIGH (ref 65–99)
Potassium: 4 mmol/L (ref 3.5–5.3)
Sodium: 140 mmol/L (ref 135–146)
Total Bilirubin: 0.6 mg/dL (ref 0.2–1.2)
Total Protein: 7.4 g/dL (ref 6.1–8.1)
eGFR: 109 mL/min/{1.73_m2} (ref >=60)

## 2021-07-22 LAB — MICROALBUMIN, URINE: Microalb, Ur: 1 mg/dL

## 2021-07-22 LAB — LIPID PANEL
Cholesterol: 218 mg/dL — ABNORMAL HIGH (ref <200)
HDL: 51 mg/dL (ref >=40)
LDL Cholesterol (Calc): 110 mg/dL (calc) — ABNORMAL HIGH
Non-HDL Cholesterol (Calc): 167 mg/dL (calc) — ABNORMAL HIGH (ref <130)
Total CHOL/HDL Ratio: 4.3 (calc) (ref <5.0)
Triglycerides: 396 mg/dL — ABNORMAL HIGH (ref <150)

## 2021-07-22 LAB — HEMOGLOBIN A1C
Hgb A1c MFr Bld: 6.7 % of total Hgb — ABNORMAL HIGH (ref <5.7)
Mean Plasma Glucose: 146 mg/dL
eAG (mmol/L): 8.1 mmol/L

## 2021-07-22 LAB — HIV ANTIBODY (ROUTINE TESTING W REFLEX): HIV 1&2 Ab, 4th Generation: NONREACTIVE

## 2021-07-22 LAB — HEPATITIS C ANTIBODY
Hepatitis C Ab: NONREACTIVE
SIGNAL TO CUT-OFF: 0.01 (ref <1.00)

## 2021-07-22 MED ORDER — ROSUVASTATIN CALCIUM 5 MG PO TABS: 5.0000 mg | ORAL_TABLET | Freq: Every day | ORAL | 3 refills | Status: DC

## 2021-07-22 NOTE — Addendum Note (Signed)
Addended by: Caro Laroche on: 07/22/2021 04:28 PM   Modules accepted: Orders

## 2021-12-01 ENCOUNTER — Other Ambulatory Visit: Payer: Self-pay

## 2021-12-01 ENCOUNTER — Encounter: Payer: Self-pay | Admitting: Unknown Physician Specialty

## 2021-12-01 ENCOUNTER — Ambulatory Visit: Payer: Self-pay | Admitting: Unknown Physician Specialty

## 2021-12-01 ENCOUNTER — Ambulatory Visit
Admission: RE | Admit: 2021-12-01 | Discharge: 2021-12-01 | Disposition: A | Discharge status: Home / Self Care | Payer: Self-pay | Attending: Unknown Physician Specialty | Admitting: Unknown Physician Specialty

## 2021-12-01 ENCOUNTER — Ambulatory Visit
Admission: RE | Admit: 2021-12-01 | Discharge: 2021-12-01 | Disposition: A | Discharge status: Home / Self Care | Payer: Self-pay | Source: Ambulatory Visit | Attending: Unknown Physician Specialty | Admitting: Unknown Physician Specialty

## 2021-12-01 VITALS — BP 112/68 | HR 65 | Temp 98.0°F | Resp 16 | Ht 65.0 in | Wt 148.2 lb

## 2021-12-01 DIAGNOSIS — G8929 Other chronic pain: Secondary | ICD-10-CM

## 2021-12-01 DIAGNOSIS — R9431 Abnormal electrocardiogram [ECG] [EKG]: Secondary | ICD-10-CM

## 2021-12-01 DIAGNOSIS — E7800 Pure hypercholesterolemia, unspecified: Secondary | ICD-10-CM

## 2021-12-01 DIAGNOSIS — M25512 Pain in left shoulder: Secondary | ICD-10-CM

## 2021-12-01 DIAGNOSIS — M25511 Pain in right shoulder: Secondary | ICD-10-CM

## 2021-12-01 DIAGNOSIS — E119 Type 2 diabetes mellitus without complications: Secondary | ICD-10-CM

## 2021-12-01 NOTE — Assessment & Plan Note (Signed)
Last Hgb A1C 6.7.  Recheck.

## 2021-12-01 NOTE — Progress Notes (Signed)
BP 112/68    Pulse 65    Temp 98 F (36.7 C) (Oral)    Resp 16    Ht 5' 5"  (1.651 m)    Wt 148 lb 3.2 oz (67.2 kg)    SpO2 99%    BMI 24.66 kg/m    Subjective:    Patient ID: Corey Norman, male    DOB: [birth year], 56 y.o.   MRN: [id number]  HPI: Corey Norman is a 56 y.o. male  Chief Complaint  Patient presents with   Shoulder Pain    Both sides happens mainly while standing a lot radiates to the back. No chest pain. Pain has got worst as time has been going by.   Pt state he is having shoulder pain.  This is long standing and recently worse.  States both shoulders are having pain.  Starts when standing too long but not with walking.  Working in [employer] and notices at work.    He is also here for cholesterol check.  Not taking his Crestor at this time.    Diabetes Last Hgb A1C is 6.7.  He is not on any medications for this  Social History   Socioeconomic History   Marital status: Married    Spouse name: Not on file   Number of children: Not on file   Years of education: Not on file   Highest education level: Not on file  Occupational History   Not on file  Tobacco Use   Smoking status: Never   Smokeless tobacco: Never  Substance and Sexual Activity   Alcohol use: Yes    Alcohol/week: 0.0 standard drinks    Comment: wine per night   Drug use: No   Sexual activity: Yes  Other Topics Concern   Not on file  Social History Narrative   ** Merged History Encounter **       Social Determinants of Health   Financial Resource Strain: Low Risk    Difficulty of Paying Living Expenses: Not hard at all  Food Insecurity: No Food Insecurity   Worried About Charity fundraiser in the Last Year: Never true   [place] in the Last Year: Never true  Transportation Needs: No Transportation Needs   Lack of Transportation (Medical): No   Lack of Transportation (Non-Medical): No  Physical Activity: Inactive   Days of Exercise per Week: 0 days   Minutes of Exercise  per Session: 0 min  Stress: No Stress Concern Present   Feeling of Stress : Not at all  Social Connections: Unknown   Frequency of Communication with Friends and Family: More than three times a week   Frequency of Social Gatherings with Friends and Family: Three times a week   Attends Religious Services: Patient refused   Active Member of Clubs or Organizations: No   Attends Archivist Meetings: Never   Marital Status: Married  Human resources officer Violence: Not At Risk   Fear of Current or Ex-Partner: No   Emotionally Abused: No   Physically Abused: No   Sexually Abused: No   Family History  Problem Relation Age of Onset   Hypertension Mother      Relevant past medical, surgical, family and social history reviewed and updated as indicated. Interim medical history since our last visit reviewed. Allergies and medications reviewed and updated.  Review of Systems  Per HPI unless specifically indicated above     Objective:    BP  112/68    Pulse 65    Temp 98 F (36.7 C) (Oral)    Resp 16    Ht 5' 5"  (1.651 m)    Wt 148 lb 3.2 oz (67.2 kg)    SpO2 99%    BMI 24.66 kg/m   Wt Readings from Last 3 Encounters:  12/01/21 148 lb 3.2 oz (67.2 kg)  07/21/21 147 lb 6.4 oz (66.9 kg)  02/10/21 151 lb 12.8 oz (68.9 kg)    Physical Exam Constitutional:      General: He is not in acute distress.    Appearance: Normal appearance. He is well-developed.  HENT:     Head: Normocephalic and atraumatic.  Eyes:     General: Lids are normal. No scleral icterus.       Right eye: No discharge.        Left eye: No discharge.     Conjunctiva/sclera: Conjunctivae normal.  Neck:     Vascular: No carotid bruit or JVD.  Cardiovascular:     Rate and Rhythm: Normal rate and regular rhythm.     Heart sounds: Normal heart sounds.  Pulmonary:     Effort: Pulmonary effort is normal. No respiratory distress.     Breath sounds: Normal breath sounds.  Abdominal:     Palpations: There is no  hepatomegaly or splenomegaly.  Musculoskeletal:        General: Normal range of motion.     Right shoulder: Normal. No swelling. Normal range of motion.     Left shoulder: Normal. No swelling. Normal range of motion.     Cervical back: Normal range of motion and neck supple.     Comments: Impingement signs negative.    Skin:    General: Skin is warm and dry.     Coloration: Skin is not pale.     Findings: No rash.  Neurological:     Mental Status: He is alert and oriented to person, place, and time.  Psychiatric:        Behavior: Behavior normal.        Thought Content: Thought content normal.        Judgment: Judgment normal.   EKG is showing ST elevation more pronounced than previous in leads V2 and V3  Results for orders placed or performed in visit on 07/21/21  Lipid panel  Result Value Ref Range   Cholesterol 218 (H) <200 mg/dL   HDL 51 > OR = 40 mg/dL   Triglycerides 396 (H) <150 mg/dL   LDL Cholesterol (Calc) 110 (H) mg/dL (calc)   Total CHOL/HDL Ratio 4.3 <5.0 (calc)   Non-HDL Cholesterol (Calc) 167 (H) <130 mg/dL (calc)  COMPLETE METABOLIC PANEL WITH GFR  Result Value Ref Range   Glucose, Bld 127 (H) 65 - 99 mg/dL   BUN 19 7 - 25 mg/dL   Creat 0.70 0.70 - 1.30 mg/dL   eGFR 109 > OR = 60 mL/min/1.52m   BUN/Creatinine Ratio NOT APPLICABLE 6 - 22 (calc)   Sodium 140 135 - 146 mmol/L   Potassium 4.0 3.5 - 5.3 mmol/L   Chloride 106 98 - 110 mmol/L   CO2 24 20 - 32 mmol/L   Calcium 9.3 8.6 - 10.3 mg/dL   Total Protein 7.4 6.1 - 8.1 g/dL   Albumin 4.5 3.6 - 5.1 g/dL   Globulin 2.9 1.9 - 3.7 g/dL (calc)   AG Ratio 1.6 1.0 - 2.5 (calc)   Total Bilirubin 0.6 0.2 - 1.2 mg/dL  Alkaline phosphatase (APISO) 69 35 - 144 U/L   AST 25 10 - 35 U/L   ALT 33 9 - 46 U/L  Microalbumin, urine  Result Value Ref Range   Microalb, Ur 1.0 mg/dL   RAM    Hemoglobin A1C  Result Value Ref Range   Hgb A1c MFr Bld 6.7 (H) <5.7 % of total Hgb   Mean Plasma Glucose 146 mg/dL   eAG  (mmol/L) 8.1 mmol/L  Hepatitis C Antibody  Result Value Ref Range   Hepatitis C Ab NON-REACTIVE NON-REACTIVE   SIGNAL TO CUT-OFF 0.01 <1.00  HIV Antibody (routine testing w rflx)  Result Value Ref Range   HIV 1&2 Ab, 4th Generation NON-REACTIVE NON-REACTIVE      Assessment & Plan:   Problem List Items Addressed This Visit       Unprioritized   Diabetes mellitus without complication (HCC)    Last Hgb A1C 6.7.  Recheck.        Relevant Orders   Hemoglobin A1c   Hypercholesterolemia    Check labs today      Relevant Orders   Hemoglobin A1c   Other Visit Diagnoses     Chronic pain of both shoulders    -  Primary   Bilateral shoulder pain without abnormal orthopedic exam.  EKG changes. Recommended cardiology referral   Relevant Orders   DG Chest 2 View   EKG 12-Lead   COMPLETE METABOLIC PANEL WITH GFR   Lipid panel   Ambulatory referral to Cardiology   Abnormal EKG       Multiple risk factor.  Not taking statin.  Reluctant to go to cardiology but suspect shoulder pain is stable angina. Referral made   Relevant Orders   Ambulatory referral to Cardiology   Hemoglobin A1c        Follow up plan: Return in about 3 months (around 03/01/2022).

## 2021-12-01 NOTE — Assessment & Plan Note (Signed)
Check labs today.

## 2021-12-12 LAB — COMPLETE METABOLIC PANEL WITH GFR
AG Ratio: 1.8 (calc) (ref 1.0–2.5)
ALT: 22 U/L (ref 9–46)
AST: 20 U/L (ref 10–35)
Albumin: 4.6 g/dL (ref 3.6–5.1)
Alkaline phosphatase (APISO): 73 U/L (ref 35–144)
BUN: 16 mg/dL (ref 7–25)
CO2: 30 mmol/L (ref 20–32)
Calcium: 9.4 mg/dL (ref 8.6–10.3)
Chloride: 105 mmol/L (ref 98–110)
Creat: 0.77 mg/dL (ref 0.70–1.30)
Globulin: 2.5 g/dL (calc) (ref 1.9–3.7)
Glucose, Bld: 130 mg/dL — ABNORMAL HIGH (ref 65–99)
Potassium: 4.4 mmol/L (ref 3.5–5.3)
Sodium: 142 mmol/L (ref 135–146)
Total Bilirubin: 0.5 mg/dL (ref 0.2–1.2)
Total Protein: 7.1 g/dL (ref 6.1–8.1)
eGFR: 105 mL/min/{1.73_m2} (ref >=60)

## 2021-12-12 LAB — LIPID PANEL
Cholesterol: 207 mg/dL — ABNORMAL HIGH (ref <200)
HDL: 62 mg/dL (ref >=40)
LDL Cholesterol (Calc): 105 mg/dL (calc) — ABNORMAL HIGH
Non-HDL Cholesterol (Calc): 145 mg/dL (calc) — ABNORMAL HIGH (ref <130)
Total CHOL/HDL Ratio: 3.3 (calc) (ref <5.0)
Triglycerides: 278 mg/dL — ABNORMAL HIGH (ref <150)

## 2021-12-12 LAB — HEMOGLOBIN A1C
Hgb A1c MFr Bld: 7.1 % of total Hgb — ABNORMAL HIGH (ref <5.7)
Mean Plasma Glucose: 157 mg/dL
eAG (mmol/L): 8.7 mmol/L

## 2021-12-29 ENCOUNTER — Telehealth: Payer: Self-pay

## 2021-12-29 NOTE — Telephone Encounter (Signed)
Called pt with an interpreter named Ramond Craver and relayed Malachy Mood, NP lab results message. Pt states he will not continue his crestor medication due to his results being a little high than normal and is not interested in any blood sugar medication at the time. Pt states he will watch his diet and wait till his next follow up appointment. Pt requested lab results to be printed will have them ready for him today.

## 2021-12-29 NOTE — Telephone Encounter (Signed)
Can you please review his labs!

## 2021-12-30 LAB — HEMOGLOBIN A1C
Hgb A1c MFr Bld: 7.1 % of total Hgb — ABNORMAL HIGH (ref <5.7)
Mean Plasma Glucose: 157 mg/dL
eAG (mmol/L): 8.7 mmol/L

## 2022-01-19 ENCOUNTER — Ambulatory Visit: Payer: Self-pay | Admitting: Family Medicine

## 2022-03-02 ENCOUNTER — Ambulatory Visit: Payer: Self-pay | Admitting: Internal Medicine

## 2022-03-02 NOTE — Progress Notes (Deleted)
? ?Established Patient Office Visit ? ?Subjective:  ?Patient ID: Corey Norman, male    DOB: May 06, 1966  Age: 56 y.o. MRN: [id number] ? ?CC: No chief complaint on file. ? ? ?HPI ?Corey Norman presents for follow up on diabetes.  ? ?Diabetes, Type 2: ? ?-Last A1c 2/23 7.1 ?-Medications: Nothing currently  ?-Checking BG at home: *** ?-Fasting home BG: *** ?-Post-prandial home BG: *** ?-Highest home BG since last visit: *** ?-Lowest home BG since last visit: *** ?-Diet: *** ?-Exercise: *** ?-Eye exam: *** ?-Foot exam: Due ?-Microalbumin: 8/22 ?-Statin: yes ?-PNA vaccine: *** ?-Denies symptoms of hypoglycemia, polyuria, polydipsia, numbness extremities, foot ulcers/trauma. *** ? ?HLD: ?-Medications: Crestor 5 mg ?-Patient is compliant with above medications and reports no side effects.  ?-Last lipid panel: Lipid Panel  ?   ?Component Value Date/Time  ? CHOL 207 (H) 12/11/2021 1016  ? CHOL 181 12/06/2017 1146  ? TRIG 278 (H) 12/11/2021 1016  ? HDL 62 12/11/2021 1016  ? HDL 72 12/06/2017 1146  ? CHOLHDL 3.3 12/11/2021 1016  ? VLDL 33 (H) 06/07/2017 1017  ? LDLCALC 105 (H) 12/11/2021 1016  ? LABVLDL 19 12/06/2017 1146  ? ?The 10-year ASCVD risk score (Arnett DK, et al., 2019) is: 8% ?  Values used to calculate the score: ?    Age: 62 years ?    Sex: Male ?    Is Non-Hispanic African American: No ?    Diabetic: Yes ?    Tobacco smoker: No ?    Systolic Blood Pressure: 409 mmHg ?    Is BP treated: No ?    HDL Cholesterol: 62 mg/dL ?    Total Cholesterol: 207 mg/dL ? ?Health Maintenance: ?-Blood work up to date ?- ? ?Past Medical History:  ?Diagnosis Date  ? Diabetes mellitus without complication ([place])   ? Last A1c was normal at 5.8%, not on medication.  ? ? ?No past surgical history on file. ? ?Family History  ?Problem Relation Age of Onset  ? Hypertension Mother   ? ? ?Social History  ? ?Socioeconomic History  ? Marital status: Married  ?  Spouse name: Not on file  ? Number of children: Not on file  ? Years of education: Not on file   ? Highest education level: Not on file  ?Occupational History  ? Not on file  ?Tobacco Use  ? Smoking status: Never  ? Smokeless tobacco: Never  ?Substance and Sexual Activity  ? Alcohol use: Yes  ?  Alcohol/week: 0.0 standard drinks  ?  Comment: wine per night  ? Drug use: No  ? Sexual activity: Yes  ?Other Topics Concern  ? Not on file  ?Social History Narrative  ? ** Merged History Encounter **  ?    ? ?Social Determinants of Health  ? ?Financial Resource Strain: Low Risk   ? Difficulty of Paying Living Expenses: Not hard at all  ?Food Insecurity: No Food Insecurity  ? Worried About Charity fundraiser in the Last Year: Never true  ? Ran Out of Food in the Last Year: Never true  ?Transportation Needs: No Transportation Needs  ? Lack of Transportation (Medical): No  ? Lack of Transportation (Non-Medical): No  ?Physical Activity: Inactive  ? Days of Exercise per Week: 0 days  ? Minutes of Exercise per Session: 0 min  ?Stress: No Stress Concern Present  ? Feeling of Stress : Not at all  ?Social Connections: Unknown  ? Frequency of Communication with Friends  and Family: More than three times a week  ? Frequency of Social Gatherings with Friends and Family: Three times a week  ? Attends Religious Services: Patient refused  ? Active Member of Clubs or Organizations: No  ? Attends Archivist Meetings: Never  ? Marital Status: Married  ?Intimate Partner Violence: Not At Risk  ? Fear of Current or Ex-Partner: No  ? Emotionally Abused: No  ? Physically Abused: No  ? Sexually Abused: No  ? ? ?Outpatient Medications Prior to Visit  ?Medication Sig Dispense Refill  ? rosuvastatin (CRESTOR) 5 MG tablet Take 1 tablet (5 mg total) by mouth daily. (Patient not taking: Reported on 12/01/2021) 90 tablet 3  ? ?No facility-administered medications prior to visit.  ? ? ?No Known Allergies ? ?ROS ?Review of Systems ? ?  ?Objective:  ?  ?Physical Exam ? ?There were no vitals taken for this visit. ?Wt Readings from Last 3  Encounters:  ?12/01/21 148 lb 3.2 oz (67.2 kg)  ?07/21/21 147 lb 6.4 oz (66.9 kg)  ?02/10/21 151 lb 12.8 oz (68.9 kg)  ? ? ? ?Health Maintenance Due  ?Topic Date Due  ? OPHTHALMOLOGY EXAM  Never done  ? Zoster Vaccines- Shingrix (1 of 2) Never done  ? COVID-19 Vaccine (4 - Booster for Pfizer series) 12/30/2020  ? FOOT EXAM  02/10/2022  ? ? ?There are no preventive care reminders to display for this patient. ? ?Lab Results  ?Component Value Date  ? TSH 1.770 12/06/2017  ? ?Lab Results  ?Component Value Date  ? WBC 3.9 12/06/2017  ? HGB 15.4 12/06/2017  ? HCT 47.1 12/06/2017  ? MCV 94 12/06/2017  ? PLT 218 12/06/2017  ? ?Lab Results  ?Component Value Date  ? NA 142 12/11/2021  ? K 4.4 12/11/2021  ? CO2 30 12/11/2021  ? GLUCOSE 130 (H) 12/11/2021  ? BUN 16 12/11/2021  ? CREATININE 0.77 12/11/2021  ? BILITOT 0.5 12/11/2021  ? ALKPHOS 80 12/06/2017  ? AST 20 12/11/2021  ? ALT 22 12/11/2021  ? PROT 7.1 12/11/2021  ? ALBUMIN 4.6 12/06/2017  ? CALCIUM 9.4 12/11/2021  ? EGFR 105 12/11/2021  ? ?Lab Results  ?Component Value Date  ? CHOL 207 (H) 12/11/2021  ? ?Lab Results  ?Component Value Date  ? HDL 62 12/11/2021  ? ?Lab Results  ?Component Value Date  ? LDLCALC 105 (H) 12/11/2021  ? ?Lab Results  ?Component Value Date  ? TRIG 278 (H) 12/11/2021  ? ?Lab Results  ?Component Value Date  ? CHOLHDL 3.3 12/11/2021  ? ?Lab Results  ?Component Value Date  ? HGBA1C 7.1 (H) 12/29/2021  ? ? ?  ?Assessment & Plan:  ? ?Problem List Items Addressed This Visit   ?None ? ? ?No orders of the defined types were placed in this encounter. ? ? ?Follow-up: No follow-ups on file.  ? ? ?Teodora Medici, DO ?

## 2022-08-24 ENCOUNTER — Ambulatory Visit: Payer: Self-pay | Admitting: Family Medicine

## 2022-08-24 ENCOUNTER — Encounter: Payer: Self-pay | Admitting: Family Medicine

## 2022-08-24 VITALS — BP 122/74 | HR 62 | Temp 97.8°F | Resp 16 | Ht 65.0 in | Wt 143.8 lb

## 2022-08-24 DIAGNOSIS — Z23 Encounter for immunization: Secondary | ICD-10-CM

## 2022-08-24 DIAGNOSIS — E7800 Pure hypercholesterolemia, unspecified: Secondary | ICD-10-CM

## 2022-08-24 DIAGNOSIS — E559 Vitamin D deficiency, unspecified: Secondary | ICD-10-CM

## 2022-08-24 DIAGNOSIS — E119 Type 2 diabetes mellitus without complications: Secondary | ICD-10-CM

## 2022-08-24 DIAGNOSIS — Z5181 Encounter for therapeutic drug level monitoring: Secondary | ICD-10-CM

## 2022-08-24 DIAGNOSIS — E781 Pure hyperglyceridemia: Secondary | ICD-10-CM

## 2022-08-24 NOTE — Progress Notes (Signed)
Name: Corey Norman   MRN: [id number]    DOB: May 20, 1966   Date:08/24/2022       Progress Note  Chief Complaint  Patient presents with   Follow-up   Diabetes   Hyperlipidemia     Subjective:   Corey Norman is a 56 y.o. male, presents to clinic for routine f/up   DM:   Not on meds, managing with strict diet Not checking CBGs Denies: Polyuria, polydipsia, vision changes, neuropathy, hypoglycemia Recent pertinent labs: Lab Results  Component Value Date   HGBA1C 7.1 (H) 12/29/2021   HGBA1C 7.1 (H) 12/11/2021   HGBA1C 6.7 (H) 07/21/2021   Lab Results  Component Value Date   MICROALBUR 1.0 07/21/2021   LDLCALC 105 (H) 12/11/2021   CREATININE 0.77 12/11/2021   Standard of care and health maintenance: Urine Microalbumin:  due-ordered today Foot exam:  due-done DM eye exam:  pt says his vision is fine - I explained need to do DM eye exam annually to check for retinal damage w/o sx ACEI/ARB:  not taking- reviewed indication Statin:  not taking prescribed crestor - reviewed indication and reason for statin, ACEI/ARB and why we use them in DM even well controlled DM - explained with help of translator line, with wife present, and additional info about standards of DM care printed for the pt to review at home with the help of his daughter  Hyperlipidemia: Prescribed crestor 5 mg, not taking, he wants his cholesterol checked again with diet efforts, I explained that statin will be indicated no matter what his cholesterol is as long as he has T2DM Last Lipids: Lab Results  Component Value Date   CHOL 207 (H) 12/11/2021   HDL 62 12/11/2021   LDLCALC 105 (H) 12/11/2021   TRIG 278 (H) 12/11/2021   CHOLHDL 3.3 12/11/2021   - Denies: Chest pain, shortness of breath, myalgias, claudication Not on ACEI/ARB, no hx of HTN, BP in normal range BP Readings from Last 3 Encounters:  08/24/22 122/74  12/01/21 112/68  07/21/21 118/82   Weight down about 5 lbs Wt Readings from Last 5 Encounters:   08/24/22 143 lb 12.8 oz (65.2 kg)  12/01/21 148 lb 3.2 oz (67.2 kg)  07/21/21 147 lb 6.4 oz (66.9 kg)  02/10/21 151 lb 12.8 oz (68.9 kg)  05/06/20 142 lb 1.6 oz (64.5 kg)   BMI Readings from Last 5 Encounters:  08/24/22 23.93 kg/m  12/01/21 24.66 kg/m  07/21/21 24.53 kg/m  02/10/21 25.26 kg/m  05/06/20 23.65 kg/m        Current Outpatient Medications:    rosuvastatin (CRESTOR) 5 MG tablet, Take 1 tablet (5 mg total) by mouth daily. (Patient not taking: Reported on 12/01/2021), Disp: 90 tablet, Rfl: 3  Patient Active Problem List   Diagnosis Date Noted   Diabetes mellitus without complication ([place]) 53/29/9242   Hypertriglyceridemia 07/17/2019   Hypercholesterolemia 06/07/2017   Vitamin D insufficiency 06/07/2017    No past surgical history on file.  Family History  Problem Relation Age of Onset   Hypertension Mother     Social History   Tobacco Use   Smoking status: Never   Smokeless tobacco: Never  Substance Use Topics   Alcohol use: Yes    Alcohol/week: 0.0 standard drinks of alcohol    Comment: wine per night   Drug use: No     No Known Allergies  Health Maintenance  Topic Date Due   Diabetic kidney evaluation - Urine ACR  07/16/2020  FOOT EXAM  02/10/2022   HEMOGLOBIN A1C  06/28/2022   OPHTHALMOLOGY EXAM  08/24/2022 (Originally 08/[birth year]/1975)   COVID-19 Vaccine (4 - Pfizer series) 09/09/2022 (Originally 12/30/2020)   COLONOSCOPY (Pts 45-90yrs Insurance coverage will need to be confirmed)  09/13/2022   Diabetic kidney evaluation - GFR measurement  12/11/2022   Hepatitis C Screening  Completed   HIV Screening  Completed   HPV VACCINES  Aged Out   INFLUENZA VACCINE  Discontinued   TETANUS/TDAP  Discontinued   Zoster Vaccines- Shingrix  Discontinued    Chart Review Today: I personally reviewed active problem list, medication list, allergies, family history, social history, health maintenance, notes from last encounter, lab results, imaging with the  patient/caregiver today.   Review of Systems  Constitutional: Negative.  Negative for activity change, appetite change, chills, diaphoresis, fatigue, fever and unexpected weight change.  HENT: Negative.    Eyes: Negative.   Respiratory: Negative.  Negative for shortness of breath.   Cardiovascular: Negative.  Negative for chest pain, palpitations and leg swelling.  Gastrointestinal: Negative.   Endocrine: Negative.   Genitourinary: Negative.   Musculoskeletal: Negative.  Negative for arthralgias and back pain.  Skin: Negative.  Negative for wound.  Allergic/Immunologic: Negative.   Neurological: Negative.  Negative for numbness.  Hematological: Negative.   Psychiatric/Behavioral: Negative.    All other systems reviewed and are negative.    Objective:   Vitals:   08/24/22 1107  BP: 122/74  Pulse: 62  Resp: 16  Temp: 97.8 F (36.6 C)  TempSrc: Oral  SpO2: 98%  Weight: 143 lb 12.8 oz (65.2 kg)  Height: 5\' 5"  (1.651 m)    Body mass index is 23.93 kg/m.  Physical Exam Vitals and nursing note reviewed.  Constitutional:      General: He is not in acute distress.    Appearance: Normal appearance. He is well-developed, well-groomed and normal weight. He is not ill-appearing, toxic-appearing or diaphoretic.  HENT:     Head: Normocephalic and atraumatic.     Right Ear: External ear normal.     Left Ear: External ear normal.     Nose: Nose normal.     Mouth/Throat:     Mouth: Mucous membranes are moist.  Eyes:     General: No scleral icterus.       Right eye: No discharge.        Left eye: No discharge.     Conjunctiva/sclera: Conjunctivae normal.  Cardiovascular:     Rate and Rhythm: Normal rate and regular rhythm.     Pulses: Normal pulses.     Heart sounds: Normal heart sounds. No murmur heard.    No gallop.  Pulmonary:     Effort: Pulmonary effort is normal. No respiratory distress.     Breath sounds: Normal breath sounds. No stridor. No wheezing or rales.   Abdominal:     General: Bowel sounds are normal.     Palpations: Abdomen is soft.  Musculoskeletal:     Right lower leg: No edema.     Left lower leg: No edema.  Skin:    General: Skin is warm and dry.     Capillary Refill: Capillary refill takes less than 2 seconds.     Coloration: Skin is not jaundiced or pale.     Findings: No erythema, lesion or rash.  Neurological:     Mental Status: He is alert. Mental status is at baseline.     Gait: Gait normal.  Psychiatric:  Mood and Affect: Mood normal.        Behavior: Behavior normal. Behavior is cooperative.   DM foot exam done: Diabetic Foot Exam - Simple   Simple Foot Form Diabetic Foot exam was performed with the following findings: Yes 08/24/2022 11:34 AM  Visual Inspection No deformities, no ulcerations, no other skin breakdown bilaterally: Yes Sensation Testing Intact to touch and monofilament testing bilaterally: Yes Pulse Check Posterior Tibialis and Dorsalis pulse intact bilaterally: Yes Comments Dry skin, some fungal disease to nails, no wounds, sores, ingrown nails         Assessment & Plan:   Problem List Items Addressed This Visit       Endocrine   Diabetes mellitus without complication (HCC) - Primary    Not on meds, managing with strict diet Not checking CBGs Asx, last labs show A1C not at goal, but close Recent pertinent labs: Lab Results  Component Value Date   HGBA1C 7.1 (H) 12/29/2021   HGBA1C 7.1 (H) 12/11/2021   HGBA1C 6.7 (H) 07/21/2021   Lab Results  Component Value Date   MICROALBUR 1.0 07/21/2021   LDLCALC 105 (H) 12/11/2021   Standard of care and health maintenance:  Worked with patient extensively today to educate about DM care Urine Microalbumin:  due-ordered today Foot exam:  due-done today DM eye exam:  pt says his vision is fine - I explained need to do DM eye exam annually to check for retinal damage w/o sx ACEI/ARB:  not taking- reviewed indication - explained this  will continue to be recommended to him for renal protection  Statin:  not taking prescribed crestor - reviewed indication and reason for statin      Relevant Orders   Hemoglobin A1c   Microalbumin / creatinine urine ratio   Lipid panel   COMPLETE METABOLIC PANEL WITH GFR   Ambulatory referral to Ophthalmology     Other   Hypercholesterolemia    Prescribed crestor 5 mg, patient is not taking, he wants his cholesterol checked again with diet efforts, I explained that statin will be indicated no matter what his cholesterol is as long as he has T2DM Last Lipids: Lab Results  Component Value Date   CHOL 207 (H) 12/11/2021   HDL 62 12/11/2021   LDLCALC 105 (H) 12/11/2021   TRIG 278 (H) 12/11/2021   CHOLHDL 3.3 12/11/2021  Denies: Chest pain, shortness of breath, myalgias, claudication No concerning symptoms He has tolerated statins in the past when willing to take  Recheck labs again and reminded him that statin will likely be indicated because of DM and his increased risk of stroke/MI despite his healthy diet/lifestyle efforts      Relevant Orders   Lipid panel   COMPLETE METABOLIC PANEL WITH GFR   Vitamin D insufficiency    Last vitamin D Lab Results  Component Value Date   VD25OH 33.9 12/06/2017  Last checked and normal in 2019      Hypertriglyceridemia    See HLD - likely to remain mildly elevated with DM and uncontrolled HLD and not taking statin No hx of pancreatitis Lab Results  Component Value Date   TRIG 278 (H) 12/11/2021   TRIG 396 (H) 07/21/2021   TRIG 82 05/06/2020           Relevant Orders   Lipid panel   COMPLETE METABOLIC PANEL WITH GFR   Other Visit Diagnoses     Need for influenza vaccination       refused  Need for shingles vaccine       refused   Need for tetanus, diphtheria, and acellular pertussis (Tdap) vaccine       refused   Encounter for medication monitoring       Relevant Orders   Hemoglobin A1c   Microalbumin / creatinine  urine ratio   Lipid panel   COMPLETE METABOLIC PANEL WITH GFR   CBC with Differential/Platelet        Return in about 6 months (around 02/23/2023) for Routine follow-up DM HLD.   Danelle Berry, PA-C 08/24/22 11:26 AM

## 2022-08-24 NOTE — Assessment & Plan Note (Signed)
Prescribed crestor 5 mg, patient is not taking, he wants his cholesterol checked again with diet efforts, I explained that statin will be indicated no matter what his cholesterol is as long as he has T2DM Last Lipids: Lab Results  Component Value Date   CHOL 207 (H) 12/11/2021   HDL 62 12/11/2021   LDLCALC 105 (H) 12/11/2021   TRIG 278 (H) 12/11/2021   CHOLHDL 3.3 12/11/2021  Denies: Chest pain, shortness of breath, myalgias, claudication No concerning symptoms He has tolerated statins in the past when willing to take  Recheck labs again and reminded him that statin will likely be indicated because of DM and his increased risk of stroke/MI despite his healthy diet/lifestyle efforts

## 2022-08-24 NOTE — Assessment & Plan Note (Signed)
See HLD - likely to remain mildly elevated with DM and uncontrolled HLD and not taking statin No hx of pancreatitis Lab Results  Component Value Date   TRIG 278 (H) 12/11/2021   TRIG 396 (H) 07/21/2021   TRIG 82 05/06/2020

## 2022-08-24 NOTE — Assessment & Plan Note (Signed)
Last vitamin D Lab Results  Component Value Date   VD25OH 33.9 12/06/2017   Last checked and normal in 2019

## 2022-08-24 NOTE — Assessment & Plan Note (Signed)
Not on meds, managing with strict diet Not checking CBGs Asx, last labs show A1C not at goal, but close Recent pertinent labs: Lab Results  Component Value Date   HGBA1C 7.1 (H) 12/29/2021   HGBA1C 7.1 (H) 12/11/2021   HGBA1C 6.7 (H) 07/21/2021   Lab Results  Component Value Date   MICROALBUR 1.0 07/21/2021   LDLCALC 105 (H) 12/11/2021   Standard of care and health maintenance:  Worked with patient extensively today to educate about DM care Urine Microalbumin:  due-ordered today Foot exam:  due-done today DM eye exam:  pt says his vision is fine - I explained need to do DM eye exam annually to check for retinal damage w/o sx ACEI/ARB:  not taking- reviewed indication - explained this will continue to be recommended to him for renal protection  Statin:  not taking prescribed crestor - reviewed indication and reason for statin

## 2022-08-24 NOTE — Patient Instructions (Addendum)
Lab Results  Component Value Date   HGBA1C 7.1 (H) 12/29/2021   HGBA1C 7.1 (H) 12/11/2021   HGBA1C 6.7 (H) 07/21/2021   You should be on a medicine to protect your kidneys like losartan or lisinopril, and a medicine to reduce your risk of stroke, like the crestor medicine you have been prescribed.  Lab Results  Component Value Date   CHOL 207 (H) 12/11/2021   CHOL 218 (H) 07/21/2021   CHOL 170 05/06/2020   Lab Results  Component Value Date   HDL 62 12/11/2021   HDL 51 07/21/2021   HDL 63 05/06/2020   Lab Results  Component Value Date   LDLCALC 105 (H) 12/11/2021   LDLCALC 110 (H) 07/21/2021   LDLCALC 90 05/06/2020   Lab Results  Component Value Date   TRIG 278 (H) 12/11/2021   TRIG 396 (H) 07/21/2021   TRIG 82 05/06/2020   Lab Results  Component Value Date   CHOLHDL 3.3 12/11/2021   CHOLHDL 4.3 07/21/2021   CHOLHDL 2.7 05/06/2020   No results found for: "LDLDIRECT"   Diabetes Mellitus and Standards of [place] with and managing diabetes (diabetes mellitus) can be complicated. Your diabetes treatment may be managed by a team of health care providers, including: A physician who specializes in diabetes (endocrinologist). You might also have visits with a nurse practitioner or physician assistant. Nurses. A registered dietitian. A certified diabetes care and education specialist. An exercise specialist. A pharmacist. An eye doctor. A foot specialist (podiatrist). A dental care provider. A primary care provider. A mental health care provider. How to manage your diabetes You can do many things to successfully manage your diabetes. Your health care providers will follow guidelines to help you get the best quality of care. Here are general guidelines for your diabetes management plan. Your health care providers may give you more specific instructions. Physical exams When you are diagnosed with diabetes, and each year after that, your health care provider  will ask about your medical and family history. You will have a physical exam, which may include: Measuring your height, weight, and body mass index (BMI). Checking your blood pressure. This will be done at every routine medical visit. Your target blood pressure may vary depending on your medical conditions, your age, and other factors. A thyroid exam. A skin exam. Screening for nerve damage (peripheral neuropathy). This may include checking the pulse in your legs and feet and the level of sensation in your hands and feet. A foot exam to inspect the structure and skin of your feet, including checking for cuts, bruises, redness, blisters, sores, or other problems. Screening for blood vessel (vascular) problems. This may include checking the pulse in your legs and feet and checking your temperature. Blood tests Depending on your treatment plan and your personal needs, you may have the following tests: Hemoglobin A1C (HbA1C). This test provides information about blood sugar (glucose) control over the previous 2-3 months. It is used to adjust your treatment plan, if needed. This test will be done: At least 2 times a year, if you are meeting your treatment goals. 4 times a year, if you are not meeting your treatment goals or if your goals have changed. Lipid testing, including total cholesterol, LDL and HDL cholesterol, and triglyceride levels. The goal for LDL is less than 100 mg/dL (5.5 mmol/L). If you are at high risk for complications, the goal is less than 70 mg/dL (3.9 mmol/L). The goal for HDL is 40 mg/dL (2.2  mmol/L) or higher for men, and 50 mg/dL (2.8 mmol/L) or higher for women. An HDL cholesterol of 60 mg/dL (3.3 mmol/L) or higher gives some protection against heart disease. The goal for triglycerides is less than 150 mg/dL (8.3 mmol/L). Liver function tests. Kidney function tests. Thyroid function tests.  Dental and eye exams  Visit your dentist two times a year. If you have type 1  diabetes, your health care provider may recommend an eye exam within 5 years after you are diagnosed, and then once a year after your first exam. For children with type 1 diabetes, the health care provider may recommend an eye exam when your child is age 17 or older and has had diabetes for 3-5 years. After the first exam, your child should get an eye exam once a year. If you have type 2 diabetes, your health care provider may recommend an eye exam as soon as you are diagnosed, and then every 1-2 years after your first exam. Immunizations A yearly flu (influenza) vaccine is recommended annually for everyone 6 months or older. This is especially important if you have diabetes. The pneumonia (pneumococcal) vaccine is recommended for everyone 2 years or older who has diabetes. If you are age 77 or older, you may get the pneumonia vaccine as a series of two separate shots. The hepatitis B vaccine is recommended for adults shortly after being diagnosed with diabetes. Adults and children with diabetes should receive all other vaccines according to age-specific recommendations from the Centers for Disease Control and Prevention (CDC). Mental and emotional health Screening for symptoms of eating disorders, anxiety, and depression is recommended at the time of diagnosis and after as needed. If your screening shows that you have symptoms, you may need more evaluation. You may work with a mental health care provider. Follow these instructions at home: Treatment plan You will monitor your blood glucose levels and may give yourself insulin. Your treatment plan will be reviewed at every medical visit. You and your health care provider will discuss: How you are taking your medicines, including insulin. Any side effects you have. Your blood glucose level target goals. How often you monitor your blood glucose level. Lifestyle habits, such as activity level and tobacco, alcohol, and substance use. Education Your  health care provider will assess how well you are monitoring your blood glucose levels and whether you are taking your insulin and medicines correctly. He or she may refer you to: A certified diabetes care and education specialist to manage your diabetes throughout your life, starting at diagnosis. A registered dietitian who can create and review your personal nutrition plan. An exercise specialist who can discuss your activity level and exercise plan. General instructions Take over-the-counter and prescription medicines only as told by your health care provider. Keep all follow-up visits. This is important. Where to find support There are many diabetes support networks, including: American Diabetes Association (ADA): diabetes.org Defeat Diabetes Foundation: defeatdiabetes.org Where to find more information American Diabetes Association (ADA): www.diabetes.org Association of Diabetes Care & Education Specialists (ADCES): diabeteseducator.org International Diabetes Federation (IDF): http://hill.biz/ Summary Managing diabetes (diabetes mellitus) can be complicated. Your diabetes treatment may be managed by a team of health care providers. Your health care providers follow guidelines to help you get the best quality care. You should have physical exams, blood tests, blood pressure monitoring, immunizations, and screening tests regularly. Stay updated on how to manage your diabetes. Your health care providers may also give you more specific instructions based on your individual  health. This information is not intended to replace advice given to you by your health care provider. Make sure you discuss any questions you have with your health care provider. Document Revised: 05/15/2020 Document Reviewed: 05/15/2020 Elsevier Patient Education  2023 [employer].

## 2022-08-25 ENCOUNTER — Other Ambulatory Visit: Payer: Self-pay | Admitting: Family Medicine

## 2022-08-25 DIAGNOSIS — E119 Type 2 diabetes mellitus without complications: Secondary | ICD-10-CM

## 2022-08-25 DIAGNOSIS — E781 Pure hyperglyceridemia: Secondary | ICD-10-CM

## 2022-08-25 LAB — MICROALBUMIN / CREATININE URINE RATIO
Creatinine, Urine: 89 mg/dL (ref 20–320)
Microalb Creat Ratio: 3 mcg/mg creat (ref <30)
Microalb, Ur: 0.3 mg/dL

## 2022-08-25 LAB — COMPLETE METABOLIC PANEL WITH GFR
AG Ratio: 1.7 (calc) (ref 1.0–2.5)
ALT: 37 U/L (ref 9–46)
AST: 24 U/L (ref 10–35)
Albumin: 4.8 g/dL (ref 3.6–5.1)
Alkaline phosphatase (APISO): 70 U/L (ref 35–144)
BUN: 14 mg/dL (ref 7–25)
CO2: 27 mmol/L (ref 20–32)
Calcium: 10.1 mg/dL (ref 8.6–10.3)
Chloride: 104 mmol/L (ref 98–110)
Creat: 0.81 mg/dL (ref 0.70–1.30)
Globulin: 2.8 g/dL (calc) (ref 1.9–3.7)
Glucose, Bld: 149 mg/dL — ABNORMAL HIGH (ref 65–139)
Potassium: 4.7 mmol/L (ref 3.5–5.3)
Sodium: 138 mmol/L (ref 135–146)
Total Bilirubin: 0.7 mg/dL (ref 0.2–1.2)
Total Protein: 7.6 g/dL (ref 6.1–8.1)
eGFR: 103 mL/min/{1.73_m2} (ref >=60)

## 2022-08-25 LAB — CBC WITH DIFFERENTIAL/PLATELET
Absolute Monocytes: 229 cells/uL (ref 200–950)
Basophils Absolute: 48 cells/uL (ref 0–200)
Basophils Relative: 1.1 %
Eosinophils Absolute: 48 cells/uL (ref 15–500)
Eosinophils Relative: 1.1 %
HCT: 47.1 % (ref 38.5–50.0)
Hemoglobin: 16 g/dL (ref 13.2–17.1)
Lymphs Abs: 1703 cells/uL (ref 850–3900)
MCH: 31.3 pg (ref 27.0–33.0)
MCHC: 34 g/dL (ref 32.0–36.0)
MCV: 92.2 fL (ref 80.0–100.0)
MPV: 9.6 fL (ref 7.5–12.5)
Monocytes Relative: 5.2 %
Neutro Abs: 2372 cells/uL (ref 1500–7800)
Neutrophils Relative %: 53.9 %
Platelets: 224 10*3/uL (ref 140–400)
RBC: 5.11 10*6/uL (ref 4.20–5.80)
RDW: 12.2 % (ref 11.0–15.0)
Total Lymphocyte: 38.7 %
WBC: 4.4 10*3/uL (ref 3.8–10.8)

## 2022-08-25 LAB — LIPID PANEL
Cholesterol: 222 mg/dL — ABNORMAL HIGH (ref <200)
HDL: 69 mg/dL (ref >=40)
LDL Cholesterol (Calc): 122 mg/dL (calc) — ABNORMAL HIGH
Non-HDL Cholesterol (Calc): 153 mg/dL (calc) — ABNORMAL HIGH (ref <130)
Total CHOL/HDL Ratio: 3.2 (calc) (ref <5.0)
Triglycerides: 194 mg/dL — ABNORMAL HIGH (ref <150)

## 2022-08-25 LAB — HEMOGLOBIN A1C
Hgb A1c MFr Bld: 7.1 % of total Hgb — ABNORMAL HIGH (ref <5.7)
Mean Plasma Glucose: 157 mg/dL
eAG (mmol/L): 8.7 mmol/L

## 2022-08-25 MED ORDER — LOSARTAN POTASSIUM 25 MG PO TABS: 25.0000 mg | ORAL_TABLET | Freq: Every day | ORAL | 3 refills | Status: DC

## 2022-08-25 MED ORDER — METFORMIN HCL 500 MG PO TABS: 500.0000 mg | ORAL_TABLET | Freq: Every day | ORAL | 3 refills | Status: DC

## 2022-08-25 MED ORDER — ROSUVASTATIN CALCIUM 5 MG PO TABS: 5.0000 mg | ORAL_TABLET | Freq: Every day | ORAL | 3 refills | Status: DC

## 2022-08-27 ENCOUNTER — Ambulatory Visit: Payer: Self-pay

## 2022-08-27 NOTE — Telephone Encounter (Signed)
Received call with [facility] to interpret - call dropped.  Called pt back with [facility].   Read lab result note to PT. Delsa Grana, PA-C  08/25/2022  6:10 PM EDT     Diabetes - the A1C is still a little above goal - its 7.1 and goal is 7.0 or under Cholesterol levels are elevated and higher than last labs All other labs looked good. With DM we still recommend statin, a kidney protective BP med and with A1C >7.0 we recommend starting metformin All of these medications are recommended with his type 2 diabetes and his labs and vital signs - all to help reduce the risk of heart disease, heart attack, stroke, kidney damage, etc    All meds sent in that are recommended    Pt has many questions regarding lab results and the reasons for taking each medication.   At first pt stated he would work on his diet to improve A1C in order to not take medications. He would then have labs redrawn.  Then pt stated he would take medications, but still seemed hesitant.   PT still has many questions regarding these medications and how they work together.  Please return pt's call.

## 2022-08-27 NOTE — Telephone Encounter (Signed)
Called pt with interpreter Abigail Butts

## 2022-08-27 NOTE — Telephone Encounter (Signed)
Pt stated he will take his meds for 3-4 months and he would like to recheck A1c/other bloodwork to see if numbers lowered, wants to avoid medication if possible. I see he has an appoinment till 02/2023.

## 2022-08-31 ENCOUNTER — Ambulatory Visit: Payer: BLUE CROSS/BLUE SHIELD | Admitting: Family Medicine

## 2022-09-01 ENCOUNTER — Ambulatory Visit: Payer: Self-pay | Admitting: *Deleted

## 2022-09-01 NOTE — Telephone Encounter (Signed)
Called pt with interpreter Azucena Kuba, pt verbalized he has an appointment tomorrow and rather discuss it with PCP. FYI

## 2022-09-01 NOTE — Progress Notes (Signed)
Patient ID: Corey Norman, male    DOB: [birth year], 56 y.o.   MRN: [id number]  PCP: Delsa Grana, PA-C  Chief Complaint  Patient presents with   Medication Reaction    Pt stopped taking losartan and rosuvastatin since he was experiencing Chest pain and palpitations. Pt has only been taking metformin.    Subjective:   Corey Norman is a 56 y.o. male, presents to clinic with CC of the following:  HPI  Pt presents for med side effects.  He recently agreed to start indicated meds for DM, HLD and HTN with metformin, losartan and crestor (he has tolerated statin previously) He called with concerns of rapid heart rate - nurse triage call/info copied and reviewed  Pt was instructed to hold the losartan and see if sx improved or he could stop all meds and restart one at a time.  He chose to come in for OV today - uses translation services via telephone  Chief Complaint: medication SE Symptoms: patient reports he has recently started 3 medication- metformin, rosuvastatin, losartan 3 days ago. Patient reports he is having palpitations in the afternoon for the last 2 days. Patient thinks it is related to the new medication. Patient has been given an appointment for tomorrow am- due to work. Frequency: 2 days- lasting hours Pertinent Negatives: Patient denies chest pain, SOB Disposition: _0 ED /_1 Urgent Care (no appt availability in office) / _2 Appointment(In office/virtual)/ _3  [facility]/ _4 Home Care/ _5 Refused Recommended Disposition /_6 [facility]/ _7  Follow-up with PCP Additional Notes: Patient is going to take Metformin today and wants to hold the other medication until appointment tomorrow- advised I would send note to provider for recommendations. Advised ED for chest pain, SOB, palpitations before appointment.  Hypertension:  Tried losartan for 2-3days but had SE (noted above) Some BP readings at home reported today as low at 90/60 - no syncope or near syncope Today he  denies palpitations, near syncope, CP last dose was yesterday He took all three meds in the morning at the same time BP Readings from Last 3 Encounters:  09/02/22 130/72  08/24/22 122/74  12/01/21 112/68   Doesn't want to take metformin if he doesn't have to wants to still work on diet - which we have done for the past couple years Recent pertinent labs: Lab Results  Component Value Date   HGBA1C 7.1 (H) 08/24/2022   HGBA1C 7.1 (H) 12/29/2021   HGBA1C 7.1 (H) 12/11/2021   Lab Results  Component Value Date   MICROALBUR 0.3 08/24/2022   [place] 122 (H) 08/24/2022   CREATININE 0.81 08/24/2022   Reviewed again the indication for statin, ACE/ARB, and managing DM with A1C goal <7.0   Patient Active Problem List   Diagnosis Date Noted   Diabetes mellitus without complication ([place]) 13/06/6577   Hypertriglyceridemia 07/17/2019   Hypercholesterolemia 06/07/2017   Vitamin D insufficiency 06/07/2017      Current Outpatient Medications:    rosuvastatin (CRESTOR) 5 MG tablet, Take 1 tablet (5 mg total) by mouth daily., Disp: 90 tablet, Rfl: 3   losartan (COZAAR) 25 MG tablet, Take 1 tablet (25 mg total) by mouth daily. (Patient not taking: Reported on 09/02/2022), Disp: 90 tablet, Rfl: 3   metFORMIN (GLUCOPHAGE) 500 MG tablet, Take 1 tablet (500 mg total) by mouth daily with breakfast. (Patient not taking: Reported on 09/02/2022), Disp: 90 tablet, Rfl: 3   No Known Allergies   Social History   Tobacco Use   Smoking status:  Never   Smokeless tobacco: Never  Substance Use Topics   Alcohol use: Yes    Alcohol/week: 0.0 standard drinks of alcohol    Comment: wine per night   Drug use: No      Chart Review Today: I personally reviewed active problem list, medication list, allergies, family history, social history, health maintenance, notes from last encounter, lab results, imaging with the patient/caregiver today.   Review of Systems  Constitutional: Negative.   HENT:  Negative.    Eyes: Negative.   Respiratory: Negative.    Cardiovascular: Negative.   Gastrointestinal: Negative.   Endocrine: Negative.   Genitourinary: Negative.   Musculoskeletal: Negative.   Skin: Negative.   Allergic/Immunologic: Negative.   Neurological: Negative.   Hematological: Negative.   Psychiatric/Behavioral: Negative.    All other systems reviewed and are negative.      Objective:   Vitals:   09/02/22 0904  BP: 130/72  Pulse: 64  Resp: 16  Temp: 97.8 F (36.6 C)  TempSrc: Oral  SpO2: 98%  Weight: 147 lb 3.2 oz (66.8 kg)  Height: _0  (1.651 m)    Body mass index is 24.5 kg/m.  Physical Exam Vitals and nursing note reviewed.  Constitutional:      General: He is not in acute distress.    Appearance: Normal appearance. He is well-developed, well-groomed and normal weight. He is not ill-appearing, toxic-appearing or diaphoretic.  HENT:     Head: Normocephalic and atraumatic.     Jaw: No trismus.     Right Ear: External ear normal.     Left Ear: External ear normal.  Eyes:     General: Lids are normal. No scleral icterus.    Conjunctiva/sclera: Conjunctivae normal.     Pupils: Pupils are equal, round, and reactive to light.  Neck:     Trachea: Trachea and phonation normal. No tracheal deviation.  Cardiovascular:     Rate and Rhythm: Normal rate and regular rhythm.     Pulses: Normal pulses.          Radial pulses are 2+ on the right side and 2+ on the left side.       Posterior tibial pulses are 2+ on the right side and 2+ on the left side.     Heart sounds: Normal heart sounds. No murmur heard.    No friction rub. No gallop.  Pulmonary:     Effort: Pulmonary effort is normal. No respiratory distress.     Breath sounds: Normal breath sounds. No stridor. No wheezing, rhonchi or rales.  Abdominal:     General: Bowel sounds are normal. There is no distension.     Palpations: Abdomen is soft.     Tenderness: There is no abdominal tenderness. There  is no guarding or rebound.  Musculoskeletal:        General: Normal range of motion.     Cervical back: Normal range of motion and neck supple.     Right lower leg: No edema.     Left lower leg: No edema.  Skin:    General: Skin is warm and dry.     Capillary Refill: Capillary refill takes less than 2 seconds.     Coloration: Skin is not jaundiced.     Findings: No rash.     Nails: There is no clubbing.  Neurological:     Mental Status: He is alert.     Cranial Nerves: No dysarthria or facial asymmetry.     Motor: No  tremor or abnormal muscle tone.     Gait: Gait normal.  Psychiatric:        Mood and Affect: Mood normal.        Speech: Speech normal.        Behavior: Behavior normal. Behavior is cooperative.      Results for orders placed or performed in visit on 08/24/22  Hemoglobin A1c  Result Value Ref Range   Hgb A1c MFr Bld 7.1 (H) <5.7 % of total Hgb   Mean Plasma Glucose 157 mg/dL   eAG (mmol/L) 8.7 mmol/L  Microalbumin / creatinine urine ratio  Result Value Ref Range   Creatinine, Urine 89 20 - 320 mg/dL   Microalb, Ur 0.3 mg/dL   Microalb Creat Ratio 3 <30 mcg/mg creat  Lipid panel  Result Value Ref Range   Cholesterol 222 (H) <200 mg/dL   HDL 69 > OR = 40 mg/dL   Triglycerides 194 (H) <150 mg/dL   LDL Cholesterol (Calc) 122 (H) mg/dL (calc)   Total CHOL/HDL Ratio 3.2 <5.0 (calc)   Non-HDL Cholesterol (Calc) 153 (H) <130 mg/dL (calc)  COMPLETE METABOLIC PANEL WITH GFR  Result Value Ref Range   Glucose, Bld 149 (H) 65 - 139 mg/dL   BUN 14 7 - 25 mg/dL   Creat 0.81 0.70 - 1.30 mg/dL   eGFR 103 > OR = 60 mL/min/1.47m   BUN/Creatinine Ratio SEE NOTE: 6 - 22 (calc)   Sodium 138 135 - 146 mmol/L   Potassium 4.7 3.5 - 5.3 mmol/L   Chloride 104 98 - 110 mmol/L   CO2 27 20 - 32 mmol/L   Calcium 10.1 8.6 - 10.3 mg/dL   Total Protein 7.6 6.1 - 8.1 g/dL   Albumin 4.8 3.6 - 5.1 g/dL   Globulin 2.8 1.9 - 3.7 g/dL (calc)   AG Ratio 1.7 1.0 - 2.5 (calc)   Total  Bilirubin 0.7 0.2 - 1.2 mg/dL   Alkaline phosphatase (APISO) 70 35 - 144 U/L   AST 24 10 - 35 U/L   ALT 37 9 - 46 U/L  CBC with Differential/Platelet  Result Value Ref Range   WBC 4.4 3.8 - 10.8 Thousand/uL   RBC 5.11 4.20 - 5.80 Million/uL   Hemoglobin 16.0 13.2 - 17.1 g/dL   HCT 47.1 38.5 - 50.0 %   MCV 92.2 80.0 - 100.0 fL   MCH 31.3 27.0 - 33.0 pg   MCHC 34.0 32.0 - 36.0 g/dL   RDW 12.2 11.0 - 15.0 %   Platelets 224 140 - 400 Thousand/uL   MPV 9.6 7.5 - 12.5 fL   Neutro Abs 2,372 1,500 - 7,800 cells/uL   Lymphs Abs 1,703 850 - 3,900 cells/uL   Absolute Monocytes 229 200 - 950 cells/uL   Eosinophils Absolute 48 15 - 500 cells/uL   Basophils Absolute 48 0 - 200 cells/uL   Neutrophils Relative % 53.9 %   Total Lymphocyte 38.7 %   Monocytes Relative 5.2 %   Eosinophils Relative 1.1 %   Basophils Relative 1.1 %       Assessment & Plan:   1. Side effect of medication Likely due to losartan He will continue to hold this medication  2. Palpitations None today, but did have 10/8-10/11 when taking new meds HR normal, cardiac exam reassuring RRR, no M, G, R, pt well appearing  3. [place] Reviewed standard of care and his past couple years of A1C that have been 7.1 even with  increased diet and lifestyle efforts He is going to take statin x 2 weeks then add 500 mg daily dose of metformin and he would like to wait on ACEI/ARB for now Discussed options of trying losartan 12.5 mg dose (cut pills in half) and try for 1-2 weeks - if he has mild SE I would expect them to wear off as he got used to it. If with lower dose he has severe SE then we will add med to allergy list (sensitivity/intolerance) and he would then try lisinopril 2.5 mg   4. Hypercholesterolemia 5. Hypertriglyceridemia Reviewed his high lipids which have increased despite working on diet/health and his weight is normal and he is very fit and trim appearing - reviewed the risk of  stroke with DM and HLD and he did tolerate statin previously with excellent lab response, he just wanted to get off meds in 2020 when he though his cholesterol was "too low"  I suspect he will tolerate crestor 5 just fine. We will want to recheck labs a few months after being on meds consistently  Return for 3-4 month f/up with labs DM, HLD.  (He has April 2024 appt, and due to cost/cash pay pt may wait until then to do f/up appt)      Delsa Grana, PA-C 09/02/22 9:29 AM

## 2022-09-01 NOTE — Telephone Encounter (Signed)
Summary: medication reaction   Caller requesting a return call regarding a medication reaction   Caller is not listed on dpr and requesting an interpreter   Please assist further    Interpreter:Jessie [phone] Reason for Disposition  [1] Caller has URGENT medicine question about med that PCP or specialist prescribed AND [2] triager unable to answer question  Answer Assessment - Initial Assessment Questions 1. NAME of MEDICINE: "What medicine(s) are you calling about?"     losartan (COZAAR) 25 MG         metFORMIN (GLUCOPHAGE) 500 MG tablet         rosuvastatin (CRESTOR) 5 MG tablet       2. QUESTION: "What is your question?" (e.g., double dose of medicine, side effect)     Can one of these medications cause heart race?, Vision changes/blurry vision 3. PRESCRIBER: "Who prescribed the medicine?" Reason: if prescribed by specialist, call should be referred to that group.     PCP 4. SYMPTOMS: "Do you have any symptoms?" If Yes, ask: "What symptoms are you having?"  "How bad are the symptoms (e.g., mild, moderate, severe)     Heart racing in afternoon- last 2 days- lasts 4-5 hours  Protocols used: Medication Question Call-A-AH

## 2022-09-01 NOTE — Telephone Encounter (Signed)
  Chief Complaint: medication SE Symptoms: patient reports he has recently started 3 medication- metformin, rosuvastatin, losartan 3 days ago. Patient reports he is having palpitations in the afternoon for the last 2 days. Patient thinks it is related to the new medication. Patient has been given an appointment for tomorrow am- due to work. Frequency: 2 days- lasting hours Pertinent Negatives: Patient denies chest pain, SOB Disposition: [] ED /[] Urgent Care (no appt availability in office) / [x] Appointment(In office/virtual)/ []  [facility]/ [] Home Care/ [] Refused Recommended Disposition /[] [facility]/ []  Follow-up with PCP Additional Notes: Patient is going to take Metformin today and wants to hold the other medication until appointment tomorrow- advised I would send note to provider for recommendations. Advised ED for chest pain, SOB, palpitations before appointment.

## 2022-09-02 ENCOUNTER — Encounter: Payer: Self-pay | Admitting: Family Medicine

## 2022-09-02 ENCOUNTER — Ambulatory Visit (INDEPENDENT_AMBULATORY_CARE_PROVIDER_SITE_OTHER): Payer: Self-pay | Admitting: Family Medicine

## 2022-09-02 VITALS — BP 130/72 | HR 64 | Temp 97.8°F | Resp 16 | Ht 65.0 in | Wt 147.2 lb

## 2022-09-02 DIAGNOSIS — T887XXA Unspecified adverse effect of drug or medicament, initial encounter: Secondary | ICD-10-CM

## 2022-09-02 DIAGNOSIS — R002 Palpitations: Secondary | ICD-10-CM

## 2022-09-02 DIAGNOSIS — E119 Type 2 diabetes mellitus without complications: Secondary | ICD-10-CM

## 2022-09-02 DIAGNOSIS — E7800 Pure hypercholesterolemia, unspecified: Secondary | ICD-10-CM

## 2022-09-02 DIAGNOSIS — E781 Pure hyperglyceridemia: Secondary | ICD-10-CM

## 2022-09-02 MED ORDER — LISINOPRIL 2.5 MG PO TABS: 2.5000 mg | ORAL_TABLET | Freq: Every day | ORAL | 2 refills | Status: DC

## 2022-09-07 ENCOUNTER — Telehealth: Payer: Self-pay | Admitting: Family Medicine

## 2022-09-07 NOTE — Telephone Encounter (Signed)
Pt stopped Lisinopril due to low heart rate making him feel bad will monitor at home until next appt refused appt

## 2022-10-05 LAB — HM DIABETES EYE EXAM

## 2022-12-02 ENCOUNTER — Ambulatory Visit: Payer: Self-pay | Admitting: Family Medicine

## 2022-12-06 ENCOUNTER — Other Ambulatory Visit: Payer: Self-pay | Admitting: Family Medicine

## 2022-12-20 NOTE — Progress Notes (Unsigned)
   Established Patient Office Visit  Subjective   Patient ID: Corey Norman, male    DOB: 06-01-1966  Age: 57 y.o. MRN: [id number]  No chief complaint on file.   HPI  Patient is here for follow up on chronic medical conditions.   Hypertension: -Medications: Lisinopril 2.5 mg -Patient is compliant with above medications and reports no side effects. -Checking BP at home (average): *** -Highest BP at home: *** -Lowest BP at home: *** -Denies any SOB, CP, vision changes, LE edema or symptoms of hypotension -Diet: *** -Exercise: ***  HLD:  -Medications: Nothing currently -Failed Meds: Crestor - caused palpitations and chest pain -Last lipid panel: Lipid Panel     Component Value Date/Time   CHOL 222 (H) 08/24/2022 1144   CHOL 181 12/06/2017 1146   TRIG 194 (H) 08/24/2022 1144   HDL 69 08/24/2022 1144   HDL 72 12/06/2017 1146   CHOLHDL 3.2 08/24/2022 1144   VLDL 33 (H) 06/07/2017 1017   LDLCALC 122 (H) 08/24/2022 1144   LABVLDL 19 12/06/2017 1146    Diabetes, Type 2: -Last A1c 7.1% 10/23 -Medications: Metformin 500 mg once daily, Lisinopril 2.5 mg -Patient is compliant with the above medications and reports no side effects. *** -Checking BG at home: *** -Fasting home BG: *** -Post-prandial home BG: *** -Highest home BG since last visit: *** -Lowest home BG since last visit: *** -Diet: *** -Exercise: *** -Eye exam: Due  -Foot exam: ? -Microalbumin: UTD 10/23 -Statin: No - intolerance  -PNA vaccine: Due  -Denies symptoms of hypoglycemia, polyuria, polydipsia, numbness extremities, foot ulcers/trauma. ***   {History (Optional):23778}  ROS    Objective:     There were no vitals taken for this visit. {Vitals History (Optional):23777}  Physical Exam   No results found for any visits on 12/21/22.  {Labs (Optional):23779}  The 10-year ASCVD risk score (Arnett DK, et al., 2019) is: 11.1%    Assessment & Plan:   Problem List Items Addressed This Visit    None   No follow-ups on file.    Teodora Medici, DO

## 2022-12-21 ENCOUNTER — Encounter: Payer: Self-pay | Admitting: Internal Medicine

## 2022-12-21 ENCOUNTER — Ambulatory Visit: Payer: 59 | Admitting: Internal Medicine

## 2022-12-21 VITALS — BP 110/72 | HR 71 | Temp 97.9°F | Resp 18 | Ht 65.0 in | Wt 142.2 lb

## 2022-12-21 DIAGNOSIS — E119 Type 2 diabetes mellitus without complications: Secondary | ICD-10-CM

## 2022-12-21 LAB — POCT GLYCOSYLATED HEMOGLOBIN (HGB A1C): Hemoglobin A1C: 8.3 % — AB (ref 4.0–5.6)

## 2022-12-21 MED ORDER — EMPAGLIFLOZIN 10 MG PO TABS: 10.0000 mg | ORAL_TABLET | Freq: Every day | ORAL | 1 refills | Status: DC

## 2022-12-21 NOTE — Patient Instructions (Addendum)
It was great seeing you today!  Plan discussed at today's visit: -A1c increased to 8.3% - work on decreasing sugar and carbs in the diet  -Start Jardiance 10 mg daily  -Work on decreasing wine consumption   Follow up in: 1 month  Take care and let us know if you have any questions or concerns prior to your next visit.  Dr. Rosana Berger

## 2022-12-22 ENCOUNTER — Ambulatory Visit: Payer: Self-pay | Admitting: Internal Medicine

## 2023-01-25 ENCOUNTER — Encounter: Payer: Self-pay | Admitting: Family Medicine

## 2023-01-25 ENCOUNTER — Ambulatory Visit (INDEPENDENT_AMBULATORY_CARE_PROVIDER_SITE_OTHER): Payer: 59 | Admitting: Family Medicine

## 2023-01-25 VITALS — BP 130/78 | HR 63 | Temp 98.0°F | Resp 16 | Ht 65.0 in | Wt 141.2 lb

## 2023-01-25 DIAGNOSIS — T887XXA Unspecified adverse effect of drug or medicament, initial encounter: Secondary | ICD-10-CM | POA: Diagnosis not present

## 2023-01-25 DIAGNOSIS — E7800 Pure hypercholesterolemia, unspecified: Secondary | ICD-10-CM

## 2023-01-25 DIAGNOSIS — Z5181 Encounter for therapeutic drug level monitoring: Secondary | ICD-10-CM | POA: Diagnosis not present

## 2023-01-25 DIAGNOSIS — Z91148 Patient's other noncompliance with medication regimen for other reason: Secondary | ICD-10-CM | POA: Diagnosis not present

## 2023-01-25 DIAGNOSIS — E119 Type 2 diabetes mellitus without complications: Secondary | ICD-10-CM | POA: Diagnosis not present

## 2023-01-25 DIAGNOSIS — Z556 Problems related to health literacy: Secondary | ICD-10-CM | POA: Diagnosis not present

## 2023-01-25 MED ORDER — EMPAGLIFLOZIN 10 MG PO TABS: 10.0000 mg | ORAL_TABLET | Freq: Every day | ORAL | 5 refills | Status: DC

## 2023-01-25 MED ORDER — EMPAGLIFLOZIN 10 MG PO TABS: 10.0000 mg | ORAL_TABLET | Freq: Every day | ORAL | 0 refills | Status: DC

## 2023-01-25 MED ORDER — ROSUVASTATIN CALCIUM 5 MG PO TABS: 5.0000 mg | ORAL_TABLET | Freq: Every day | ORAL | 5 refills | Status: DC

## 2023-01-25 NOTE — Assessment & Plan Note (Signed)
Patient has tried for many years managing with only diet and lifestyle efforts, but labs do not seem to improve much with diet and lifestyle efforts. he additionally has type 2 diabetes and standard of care has been explained to him several times encouraging him to try the rosuvastatin 5 mg daily which is a long-term medication which will improve cholesterol and reduce his risk of stroke which is much higher with type 2 diabetes He has very poor med compliance and often is on and off the medications but it seems that he has been on rosuvastatin for the last couple months and is tolerating it without any myalgias side effects or concerns. We have clarified with the pharmacy that he has enough refills, his bottle shows 30 pills dispensed with 7 refills available, he and his wife were encouraged to pick up the monthly refills and continue taking medication daily and his labs will be rechecked when he returns

## 2023-01-25 NOTE — Progress Notes (Signed)
Name: Corey Norman   MRN: [id number]    DOB: 13-Apr-1966   Date:01/25/2023       Progress Note  Chief Complaint  Patient presents with   Follow-up   Diabetes     Subjective:   Corey Norman is a 57 y.o. male, presents to clinic for f/up on med changes for DM Last OV with Dr. Rosana Berger Pts wife is with him and usually attempts to give 99% of history, we also do the entire visit with interpreter services on the line (though often difficult with pts wife talking over me and translator before husband can here translator or respond)  DM:   Pt managing DM with jardiance 10 - just started about 5 weeks ago Pt wife says he's out and stopped meds a month ago - I reviewed with them they just got the med 5 weeks ago and there are refills - so they aren't out - they just need to get the refill and continue taking After clarification - then it was stated that he may have been out for about 3 days and instead started taking metformin - which he previously reported severe side effects and intolerance to  While he was taking Jardiance daily he did not have any bothersome side effects or concerns he did note a little bit of increased urination but it did not bother him  Last A1c was much higher and uncontrolled  Recent pertinent labs: Lab Results  Component Value Date   HGBA1C 8.3 (A) 12/21/2022   HGBA1C 7.1 (H) 08/24/2022   HGBA1C 7.1 (H) 12/29/2021   Lab Results  Component Value Date   MICROALBUR 0.3 08/24/2022   LDLCALC 122 (H) 08/24/2022   CREATININE 0.81 08/24/2022   Standard of care and health maintenance: Urine Microalbumin:  UTD normal Foot exam:  done DM eye exam:  due -patient is wife showed me on her cell phone that they did the appointment in November we will request the records ACEI/ARB:  no -he tried lisinopril 2.5 mg and was unable to tolerate Statin:  yes -poor tolerance in the past however he does report taking Crestor daily in the morning   HM reviewed Health Maintenance  Topic  Date Due   Eye exam for diabetics  Never done   COVID-19 Vaccine (4 - 2023-24 season) 07/23/2022   Colon Cancer Screening  09/13/2022   Hemoglobin A1C  06/21/2023   Yearly kidney function blood test for diabetes  08/25/2023   Yearly kidney health urinalysis for diabetes  08/25/2023   Complete foot exam   08/25/2023   Hepatitis C Screening: USPSTF Recommendation to screen - Ages 18-79 yo.  Completed   HIV Screening  Completed   HPV Vaccine  Aged Out   DTaP/Tdap/Td vaccine  Discontinued   Flu Shot  Discontinued   Zoster (Shingles) Vaccine  Discontinued    All other questions asked and answered with interpreter services    Current Outpatient Medications:    empagliflozin (JARDIANCE) 10 MG TABS tablet, Take 1 tablet (10 mg total) by mouth daily before breakfast., Disp: 30 tablet, Rfl: 1   rosuvastatin (CRESTOR) 5 MG tablet, Take 1 tablet (5 mg total) by mouth daily., Disp: 90 tablet, Rfl: 3   lisinopril (ZESTRIL) 2.5 MG tablet, Take 2.5 mg by mouth daily. (Patient not taking: Reported on 01/25/2023), Disp: , Rfl:    metFORMIN (GLUCOPHAGE) 500 MG tablet, Take 500 mg by mouth every morning. (Patient not taking: Reported on 01/25/2023), Disp: , Rfl:  Patient Active Problem List   Diagnosis Date Noted   Diabetes mellitus without complication ([place]) 123456   Hypertriglyceridemia 07/17/2019   Hypercholesterolemia 06/07/2017   Vitamin D insufficiency 06/07/2017    No past surgical history on file.  Family History  Problem Relation Age of Onset   Hypertension Mother     Social History   Tobacco Use   Smoking status: Never   Smokeless tobacco: Never  Substance Use Topics   Alcohol use: Yes    Alcohol/week: 0.0 standard drinks of alcohol    Comment: wine per night   Drug use: No     No Known Allergies  Health Maintenance  Topic Date Due   OPHTHALMOLOGY EXAM  Never done   COVID-19 Vaccine (4 - 2023-24 season) 07/23/2022   COLONOSCOPY (Pts 45-19yr Insurance coverage will  need to be confirmed)  09/13/2022   HEMOGLOBIN A1C  06/21/2023   Diabetic kidney evaluation - eGFR measurement  08/25/2023   Diabetic kidney evaluation - Urine ACR  08/25/2023   FOOT EXAM  08/25/2023   Hepatitis C Screening  Completed   HIV Screening  Completed   HPV VACCINES  Aged Out   DTaP/Tdap/Td  Discontinued   INFLUENZA VACCINE  Discontinued   Zoster Vaccines- Shingrix  Discontinued    Chart Review Today: I personally reviewed active problem list, medication list, allergies, family history, social history, health maintenance, notes from last encounter, lab results, imaging with the patient/caregiver today.   Review of Systems  Constitutional: Negative.   HENT: Negative.    Eyes: Negative.   Respiratory: Negative.    Cardiovascular: Negative.   Gastrointestinal: Negative.   Endocrine: Negative.   Genitourinary: Negative.   Musculoskeletal: Negative.   Skin: Negative.   Allergic/Immunologic: Negative.   Neurological: Negative.   Hematological: Negative.   Psychiatric/Behavioral: Negative.    All other systems reviewed and are negative.    Objective:   Vitals:   01/25/23 1026  BP: 130/78  Pulse: 63  Resp: 16  Temp: 98 F (36.7 C)  TempSrc: Oral  SpO2: 100%  Weight: 141 lb 3.2 oz (64 kg)  Height: '5\' 5"'$  (1.651 m)    Body mass index is 23.5 kg/m.  Physical Exam Vitals and nursing note reviewed.  Constitutional:      General: He is not in acute distress.    Appearance: Normal appearance. He is well-developed and normal weight. He is not ill-appearing, toxic-appearing or diaphoretic.  HENT:     Head: Normocephalic and atraumatic.     Nose: Nose normal.  Eyes:     General:        Right eye: No discharge.        Left eye: No discharge.     Conjunctiva/sclera: Conjunctivae normal.  Neck:     Trachea: No tracheal deviation.  Cardiovascular:     Rate and Rhythm: Normal rate and regular rhythm.     Pulses: Normal pulses.     Heart sounds: Normal heart  sounds. No murmur heard.    No friction rub. No gallop.  Pulmonary:     Effort: Pulmonary effort is normal. No respiratory distress.     Breath sounds: No stridor.  Musculoskeletal:        General: Normal range of motion.  Skin:    General: Skin is warm and dry.     Findings: No rash.  Neurological:     Mental Status: He is alert.     Motor: No abnormal muscle tone.  Coordination: Coordination normal.  Psychiatric:        Mood and Affect: Mood normal.        Behavior: Behavior normal.         Assessment & Plan:   Problem List Items Addressed This Visit       Endocrine   Diabetes mellitus without complication ([place]) - Primary    DM uncontrolled - here for f/up after starting jardiance 10 mg Lab Results  Component Value Date   HGBA1C 8.3 (A) 12/21/2022   HGBA1C 7.1 (H) 08/24/2022   HGBA1C 7.1 (H) 12/29/2021   HGBA1C 7.1 (H) 12/11/2021   HGBA1C 6.7 (H) 07/21/2021  Last A1C was quite a bit higher - it was POC so there may be more lab variability/innaccuracy - we will get blood draw with lipids next time when able to recheck A1C on 03/22/23 The management of DM for pt is difficult with pt's med hesitancy, he often has only tried meds briefly and returns off med reporting SE/intolerances.  He often wants to repeat labwork frequently despite not being able to maintain a simple tx plan for a few months.  I also suspect there is a good amount of misunderstanding with interpreter on phone, wife present and talking over and interrupting and answering for patient.  Today I asked her to hold her thoughts so that I could finish my sentence, then the interpreter could relay to pt, and then the patient have time to think and answer for himself.  This did seem to help but proved difficult and time consuming. Pt did take jardiance and tolerate it but the rx ran out and they did not pick up refills. The plan will be to get refill and continue jardiance 10 mg daily and continue healthy diet  efforts.   He has three old bottles of metformin - instructed them to throw away or return to a pharmacy bin for old meds - and do not take randomly here and there - likely to have more severe SE If A1c does not improve we may need to try metformin again in the future - but for now continue jardiance/diet/lifestyle  Return for labs Everything else is up to date including microalbumin, renal labs, diabetic foot exam, diabetic eye exam which we are requesting He is currently taking a statin He was very concerned about side effects when we previously attempted to ensure renal protection with lisinopril 2.5 mg daily... he also has those old med bottles - also throw away/dispose of for now. Focus on med compliance with only jardiance and crestor daily in am, diet/lifestyle efforts, f/up 4/30 for lab and ensure he is still tolerating meds.        Relevant Medications   empagliflozin (JARDIANCE) 10 MG TABS tablet   rosuvastatin (CRESTOR) 5 MG tablet   Other Relevant Orders   COMPLETE METABOLIC PANEL WITH GFR   Hemoglobin A1c   Lipid panel     Other   Hypercholesterolemia    Patient has tried for many years managing with only diet and lifestyle efforts, but labs do not seem to improve much with diet and lifestyle efforts. he additionally has type 2 diabetes and standard of care has been explained to him several times encouraging him to try the rosuvastatin 5 mg daily which is a long-term medication which will improve cholesterol and reduce his risk of stroke which is much higher with type 2 diabetes He has very poor med compliance and often is on and off the medications  but it seems that he has been on rosuvastatin for the last couple months and is tolerating it without any myalgias side effects or concerns. We have clarified with the pharmacy that he has enough refills, his bottle shows 30 pills dispensed with 7 refills available, he and his wife were encouraged to pick up the monthly refills and  continue taking medication daily and his labs will be rechecked when he returns      Relevant Medications   rosuvastatin (CRESTOR) 5 MG tablet   Other Relevant Orders   COMPLETE METABOLIC PANEL WITH GFR   Lipid panel   Medication nonadherence due to language barrier    Extensive amount of time today spent educating pt about meds, dx, tx plan and ensuring the patient understood and had time to hear everything for himself and provide answers for himself.  He would often still only give one word answers or very short answers via interpreter, but he nodded, engaged in good eye contact. He was given the opportunity to ask questions prior to leaving.  Will be important to pt's care that we set aside more time for his routine appts (ie: req 40 min spots not 20), and at the beginning of the visits ensure that pt actually is able to use interpreter services with hopefully less confusion/miscommunication/misunderstanding with his wife speaking at the same time as provider/pt/interpreter.   Consider asking spouse to leave exam room if unable to hold thoughts. I also believe interpreters before have stated there is a [place] dialect issue that makes the interpretation difficult.  I am also not sure if there is health literacy barriers, cultural barriers or just overall med hesitancy and health anxieties at play?        Requires document translation service to support health literacy    Health literacy issues? We have tried spending more and more time with OV and interpreter services to explain standard of care and tx plan to pt - ie - why he needs a statin when he has DM, I have previously tried to simplify and organize one or two concepts/standards only and focus on that with 3 month f/up, but pt continues to return frequently with little understanding of past tx plan or recall of prior discussion. We may need to eval/explore further - getting written instructions in pts language may be helpful  since more time in room has not seemed to help.    Last OV I wrote out on AVS a summary of DM care - however its all in [place].  I did ask his daughter to help explain to him everything that was on AVS to see if that would help.   He did get his DM eye exam - which was progress And also restarted statin - so there has been a little improvement      Other Visit Diagnoses     Side effect of medication       multiple SE with brief med attempts previously, GI upset with metformin, dizziness/palpitations with 2.5 mg lisinopril - SE vs anxiety vs hypersensitivity?   Medication monitoring encounter       labs soonest on 03/22/2023 - may want to do labs on 03/22/2023 and then have OV a day or two after with results available to maximize office time   Relevant Orders   COMPLETE METABOLIC PANEL WITH GFR   Hemoglobin A1c   Lipid panel        Return in about 8 weeks (around 03/22/2023) for DM/HLD  f/up and labs.    More than 40 minutes were spent with the patient in the exam room today to obtain history and do assessment/exam Additional 5 min chart review today prior to entering room 15+ after pt and wife left coordinating care, checking meds/Rx with pharmacy, calling pt and clarifying said info, completing documentation/chart Compelling issues/barriers to care requiring much more time for OV than usual for this pt with the noted current Dx above SDOH/dx added to problem list Will coordinate with clinic admin and front office and [facility] staff to help optimize next f/up appt and pts care going forward.  Delsa Grana, PA-C 01/25/23 10:39 AM

## 2023-01-25 NOTE — Assessment & Plan Note (Signed)
Extensive amount of time today spent educating pt about meds, dx, tx plan and ensuring the patient understood and had time to hear everything for himself and provide answers for himself.  He would often still only give one word answers or very short answers via interpreter, but he nodded, engaged in good eye contact. He was given the opportunity to ask questions prior to leaving.  Will be important to pt's care that we set aside more time for his routine appts (ie: req 40 min spots not 20), and at the beginning of the visits ensure that pt actually is able to use interpreter services with hopefully less confusion/miscommunication/misunderstanding with his wife speaking at the same time as provider/pt/interpreter.   Consider asking spouse to leave exam room if unable to hold thoughts. I also believe interpreters before have stated there is a [place] dialect issue that makes the interpretation difficult.  I am also not sure if there is health literacy barriers, cultural barriers or just overall med hesitancy and health anxieties at play?

## 2023-01-25 NOTE — Assessment & Plan Note (Signed)
DM uncontrolled - here for f/up after starting jardiance 10 mg Lab Results  Component Value Date   HGBA1C 8.3 (A) 12/21/2022   HGBA1C 7.1 (H) 08/24/2022   HGBA1C 7.1 (H) 12/29/2021   HGBA1C 7.1 (H) 12/11/2021   HGBA1C 6.7 (H) 07/21/2021   Last A1C was quite a bit higher - it was POC so there may be more lab variability/innaccuracy - we will get blood draw with lipids next time when able to recheck A1C on 03/22/23 The management of DM for pt is difficult with pt's med hesitancy, he often has only tried meds briefly and returns off med reporting SE/intolerances.  He often wants to repeat labwork frequently despite not being able to maintain a simple tx plan for a few months.  I also suspect there is a good amount of misunderstanding with interpreter on phone, wife present and talking over and interrupting and answering for patient.  Today I asked her to hold her thoughts so that I could finish my sentence, then the interpreter could relay to pt, and then the patient have time to think and answer for himself.  This did seem to help but proved difficult and time consuming. Pt did take jardiance and tolerate it but the rx ran out and they did not pick up refills. The plan will be to get refill and continue jardiance 10 mg daily and continue healthy diet efforts.   He has three old bottles of metformin - instructed them to throw away or return to a pharmacy bin for old meds - and do not take randomly here and there - likely to have more severe SE If A1c does not improve we may need to try metformin again in the future - but for now continue jardiance/diet/lifestyle  Return for labs Everything else is up to date including microalbumin, renal labs, diabetic foot exam, diabetic eye exam which we are requesting He is currently taking a statin He was very concerned about side effects when we previously attempted to ensure renal protection with lisinopril 2.5 mg daily... he also has those old med bottles - also  throw away/dispose of for now. Focus on med compliance with only jardiance and crestor daily in am, diet/lifestyle efforts, f/up 4/30 for lab and ensure he is still tolerating meds.

## 2023-01-25 NOTE — Assessment & Plan Note (Signed)
Health literacy issues? We have tried spending more and more time with OV and interpreter services to explain standard of care and tx plan to pt - ie - why he needs a statin when he has DM, I have previously tried to simplify and organize one or two concepts/standards only and focus on that with 3 month f/up, but pt continues to return frequently with little understanding of past tx plan or recall of prior discussion. We may need to eval/explore further - getting written instructions in pts language may be helpful since more time in room has not seemed to help.    Last OV I wrote out on AVS a summary of DM care - however its all in [place].  I did ask his daughter to help explain to him everything that was on AVS to see if that would help.   He did get his DM eye exam - which was progress And also restarted statin - so there has been a little improvement

## 2023-01-26 NOTE — Progress Notes (Signed)
Pt wife verbalized understanding ?

## 2023-02-22 ENCOUNTER — Ambulatory Visit: Payer: Self-pay | Admitting: Family Medicine

## 2023-03-22 ENCOUNTER — Encounter: Payer: Self-pay | Admitting: Family Medicine

## 2023-03-22 ENCOUNTER — Ambulatory Visit (INDEPENDENT_AMBULATORY_CARE_PROVIDER_SITE_OTHER): Payer: 59 | Admitting: Family Medicine

## 2023-03-22 VITALS — BP 118/74 | HR 58 | Temp 97.5°F | Resp 16 | Ht 65.0 in | Wt 138.1 lb

## 2023-03-22 DIAGNOSIS — Z91148 Patient's other noncompliance with medication regimen for other reason: Secondary | ICD-10-CM

## 2023-03-22 DIAGNOSIS — E119 Type 2 diabetes mellitus without complications: Secondary | ICD-10-CM | POA: Diagnosis not present

## 2023-03-22 DIAGNOSIS — Z556 Problems related to health literacy: Secondary | ICD-10-CM | POA: Diagnosis not present

## 2023-03-22 DIAGNOSIS — E7800 Pure hypercholesterolemia, unspecified: Secondary | ICD-10-CM | POA: Diagnosis not present

## 2023-03-22 NOTE — Assessment & Plan Note (Signed)
Additional time today again spent educating pt, offering printed and verbal resources and info with help of interpreter in person today which was much improved visit compared to using the translator via telephone

## 2023-03-22 NOTE — Assessment & Plan Note (Signed)
on crestor for the last 6 months - but hx of med noncompliance - on and off meds, SE/sensitivities and running out often though refills are at the pharmacy He reports good med compliance and no SE or concerns for the past couple months He has worked on Engineer, water Value Date   CHOL 222 (H) 08/24/2022   HDL 69 08/24/2022   LDLCALC 122 (H) 08/24/2022   TRIG 194 (H) 08/24/2022   CHOLHDL 3.2 08/24/2022  - Denies: Chest pain, shortness of breath, myalgias, claudication Compliant with meds, no SE, no myalgias, fatigue or jaundice Diet and exercise recommendations for HLD reviewed

## 2023-03-22 NOTE — Assessment & Plan Note (Signed)
OV done with in person interpreter Much better than prior visits Pt was able to ask and answer more questions with help of in person interpreter We were able to reference labs and info on chart/computer with pt in the room and they had ample opportunity to review and ask questions

## 2023-03-22 NOTE — Patient Instructions (Addendum)
Come in one week before your next appointment and complete your labs so that we have the results and can discuss them and make changes or decisions at your next appointment.  If you want to check morning fasting blood sugars a good goal we hope to have you at is 80-130   ????? Diabetes Mellitus Basics  ????????????????????????????????????????????????? ?????????????????? ????????????????????? ???????????????????? ??????????????????????????????????????????????????????????? ?????????? ??????????????????????????????????????????????????????????????????????????????????????? ??????????????????????????????????????????????? ????????????????? ???????80-130 mg/dL (1.6-1.0 mmol/L)? ????????? 180 mg/dL (10 mmol/L)? HbA1c????? A1c?????? 7%? ?????????????????? ???????????????? ????????? ????? ?????????????????????????????????????? Jardiance 10 mg once daily in the morning Crestor 5 mg once daily  ????  ???????????????????????????? ??????????????? ???_________________??___________________________________ ???_________________??___________________________________ ???_________________??___________________________________ ???_________________??___________________________________ ???_________________??___________________________________ ???_________________??___________________________________  ??? ????????????????? 70 mg/dL(3.9 mmol/L)???????? ??? ??? ?????? ???????? ??? ??? ??? ????? ??? ????????? ?????????? ??????? ??????? ??? ????? ??????????????????????????????????????? 15:15 ??? ?????????????? 15 ????????????? ???????????? ??????? 3-4 ?? ???? 3-5 ?? ????? 120 mL?4 ???? ????????????? 120-180 mL?4-6 ???? ?????? 15 mL?1 ???? ??????? 15 ???????????? ????????????? 70 mg/dL (3.9 mmol/L)????? 15 g ?????? ?? 3 ???????????? 70 mg/dL(3.9 mmol/L)????????? ????????1 ?????????? ?????? ?????? ?54 mg/dL(3 mmol/L)????????????????? ????????????????????????????????????????  911???????????? ??????????????? ?????????????? ??????????????????? ??????????? ??????????????? ?????????????????? ??????????????????? ???????????? ?????????????????? ?????????? American Diabetes Association??????????[email or web address] Association of Diabetes Care Education Specialists????????????[email or web address] ???????????????????? ?????? 2 ?????? 240 mg/dL (96.0 mmol/L)? ??????? 2 ???????????? ??????????? 6 ???? ????????? ?????? ?????? ???? ?????????????? ?????? 54 mg/dL (3 mmol/L)? ???????? ????????????? ?????? ???????????????????????????????????????????????????? 911???????????? ?? ?????????????????????????????????????????? ???????????????? ????????????????? ???????????????? ???????????????????????????????????????????????? Document Revised: 04/18/2020 Document Reviewed: 04/18/2020 Elsevier Patient Education  2023 [employer].

## 2023-03-22 NOTE — Assessment & Plan Note (Signed)
Last labs A1C increased and was uncontrolled, prior to that gradual increase in A1C last 1-2 years around 7.1 on and off meds Pt managing DM with jardiance previously did not tolerate metformin Reports good med compliance Pt has no SE from meds. Blood sugars - not checking at home Denies: Polyuria, polydipsia, vision changes, neuropathy, hypoglycemia Recent pertinent labs: Lab Results  Component Value Date   HGBA1C 8.3 (A) 12/21/2022   HGBA1C 7.1 (H) 08/24/2022   HGBA1C 7.1 (H) 12/29/2021   Lab Results  Component Value Date   MICROALBUR 0.3 08/24/2022   LDLCALC 122 (H) 08/24/2022   CREATININE 0.81 08/24/2022   Standard of care and health maintenance: Urine Microalbumin:  recheck today with higher A1c Foot exam:  UTD- due oct DM eye exam:  UTD ACEI/ARB:  no - pt did not take after reporting SE Statin:  crestor  Significant amount of education today with pt and wife with help of interpreter Including more questions about lab work, diet/lifestyle efforts, and duration of needing meds I explained the goal is to keep the A1C <7.0 and medicines needed to achieve that may change over the years - if he is able to reduce meds at any time we will help him do that  Offered DM education referral with RD, but pt and wife declined Multiple hand outs given to them today with more info about diabetes - know your numbers, reviewed diet/lifestyle efforts, fasting CBG range that would correlate to well controlled sugars/A1C  Plan to recheck labs - adjust meds if needed 3 month f/up if A1C is >7.0 - increase jardiance to 25 mg 4-6 month f/up if A1C is at goal - keep jardiance the same dose

## 2023-03-22 NOTE — Progress Notes (Signed)
Name: Corey Norman   MRN: [id number]    DOB: Oct 17, 1966   Date:03/22/2023       Progress Note  Chief Complaint  Patient presents with   Follow-up   Diabetes   Hyperlipidemia   Pt here today witn in person translator/interpreter and his wife  Subjective:   Corey Norman is a 57 y.o. male, presents to clinic for uncontrolled DM f/up on med changes  DM:   Pt managing DM with jardiance previously did not tolerate metformin Reports good med compliance Pt has no SE from meds. Blood sugars - not checking at home Denies: Polyuria, polydipsia, vision changes, neuropathy, hypoglycemia Recent pertinent labs: Lab Results  Component Value Date   HGBA1C 8.3 (A) 12/21/2022   HGBA1C 7.1 (H) 08/24/2022   HGBA1C 7.1 (H) 12/29/2021   Lab Results  Component Value Date   MICROALBUR 0.3 08/24/2022   LDLCALC 122 (H) 08/24/2022   CREATININE 0.81 08/24/2022   Standard of care and health maintenance: Urine Microalbumin:  recheck today with higher A1c Foot exam:  UTD DM eye exam:  UTD ACEI/ARB:  no - pt did not take after reporting SE Statin:  crestor  HLD on crestor for the last 6 months - but hx of med noncompliance - on and off meds, SE/sensitivities and running out often though refills are at the pharmacy He reports good med compliance and no SE or concerns Lab Results  Component Value Date   CHOL 222 (H) 08/24/2022   HDL 69 08/24/2022   LDLCALC 122 (H) 08/24/2022   TRIG 194 (H) 08/24/2022   CHOLHDL 3.2 08/24/2022    He asks about going off medications if his labs get better - we reviewed his history of A1C - overall his weights have not changed and he has a healthy diet/nutrition  I expect he will need to continue meds to keep the A1C controlled with the trend in his labs over the years Lab Results  Component Value Date   HGBA1C 8.3 (A) 12/21/2022   HGBA1C 7.1 (H) 08/24/2022   HGBA1C 7.1 (H) 12/29/2021   HGBA1C 7.1 (H) 12/11/2021   HGBA1C 6.7 (H) 07/21/2021   HGBA1C 6.2 (H)  05/06/2020   HGBA1C 6.4 (H) 11/06/2019   HGBA1C 6.8 (H) 07/17/2019   HGBA1C 6.4 (H) 01/02/2019   HGBA1C 6.2 (H) 06/27/2018   HGBA1C 6.2 11/29/2017   HGBA1C 6.2 06/07/2017   HGBA1C 5.9 11/16/2016   HGBA1C 5.8 11/27/2015       Current Outpatient Medications:    empagliflozin (JARDIANCE) 10 MG TABS tablet, Take 1 tablet (10 mg total) by mouth daily before breakfast., Disp: 30 tablet, Rfl: 5   rosuvastatin (CRESTOR) 5 MG tablet, Take 1 tablet (5 mg total) by mouth daily., Disp: 30 tablet, Rfl: 5  Patient Active Problem List   Diagnosis Date Noted   Medication nonadherence due to language barrier 01/25/2023   Requires document translation service to support health literacy 01/25/2023   Diabetes mellitus without complication (HCC) 02/10/2021   Hypercholesterolemia 06/07/2017   Vitamin D insufficiency 06/07/2017    No past surgical history on file.  Family History  Problem Relation Age of Onset   Hypertension Mother     Social History   Tobacco Use   Smoking status: Never   Smokeless tobacco: Never  Substance Use Topics   Alcohol use: Yes    Alcohol/week: 0.0 standard drinks of alcohol    Comment: wine per night   Drug use: No  No Known Allergies  Health Maintenance  Topic Date Due   COVID-19 Vaccine (4 - 2023-24 season) 04/07/2023 (Originally 07/23/2022)   COLONOSCOPY (Pts 45-68yrs Insurance coverage will need to be confirmed)  03/21/2024 (Originally 09/13/2022)   HEMOGLOBIN A1C  06/21/2023   Diabetic kidney evaluation - eGFR measurement  08/25/2023   Diabetic kidney evaluation - Urine ACR  08/25/2023   FOOT EXAM  08/25/2023   OPHTHALMOLOGY EXAM  10/06/2023   Hepatitis C Screening  Completed   HIV Screening  Completed   HPV VACCINES  Aged Out   DTaP/Tdap/Td  Discontinued   INFLUENZA VACCINE  Discontinued   Zoster Vaccines- Shingrix  Discontinued    Chart Review Today: I personally reviewed active problem list, medication list, allergies, family history,  social history, health maintenance, notes from last encounter, lab results, imaging with the patient/caregiver today.   Review of Systems  Constitutional: Negative.   HENT: Negative.    Eyes: Negative.   Respiratory: Negative.    Cardiovascular: Negative.   Gastrointestinal: Negative.   Endocrine: Negative.   Genitourinary: Negative.   Musculoskeletal: Negative.   Skin: Negative.   Allergic/Immunologic: Negative.   Neurological: Negative.   Hematological: Negative.   Psychiatric/Behavioral: Negative.    All other systems reviewed and are negative.    Objective:   Vitals:   03/22/23 1026  BP: 118/74  Pulse: (!) 58  Resp: 16  Temp: (!) 97.5 F (36.4 C)  TempSrc: Oral  SpO2: 99%  Weight: 138 lb 1.6 oz (62.6 kg)  Height: 5\' 5"  (1.651 m)    Body mass index is 22.98 kg/m.  Physical Exam Vitals and nursing note reviewed.  Constitutional:      General: He is not in acute distress.    Appearance: Normal appearance. He is well-developed and normal weight. He is not ill-appearing, toxic-appearing or diaphoretic.  HENT:     Head: Normocephalic and atraumatic.     Right Ear: Tympanic membrane, ear canal and external ear normal. There is no impacted cerumen.     Left Ear: Tympanic membrane, ear canal and external ear normal. There is no impacted cerumen.     Nose: Nose normal.     Mouth/Throat:     Mouth: Mucous membranes are moist.     Pharynx: Oropharynx is clear. Posterior oropharyngeal erythema (mild OP injection) present. No oropharyngeal exudate.  Eyes:     General: No scleral icterus.       Right eye: No discharge.        Left eye: No discharge.     Conjunctiva/sclera: Conjunctivae normal.  Neck:     Trachea: No tracheal deviation.  Cardiovascular:     Rate and Rhythm: Normal rate and regular rhythm.     Pulses: Normal pulses.     Heart sounds: Normal heart sounds. No murmur heard.    No friction rub. No gallop.  Pulmonary:     Effort: Pulmonary effort is  normal. No respiratory distress.     Breath sounds: Normal breath sounds. No stridor. No wheezing or rales.  Musculoskeletal:     Right lower leg: No edema.     Left lower leg: No edema.  Skin:    General: Skin is warm and dry.     Capillary Refill: Capillary refill takes less than 2 seconds.     Coloration: Skin is not pale.     Findings: No rash.  Neurological:     Mental Status: He is alert. Mental status is at baseline.  Motor: No abnormal muscle tone.     Coordination: Coordination normal.     Gait: Gait normal.  Psychiatric:        Mood and Affect: Mood normal.        Behavior: Behavior normal.         Assessment & Plan:   Problem List Items Addressed This Visit       Endocrine   Diabetes mellitus without complication (HCC) - Primary    Last labs A1C increased and was uncontrolled, prior to that gradual increase in A1C last 1-2 years around 7.1 on and off meds Pt managing DM with jardiance previously did not tolerate metformin Reports good med compliance Pt has no SE from meds. Blood sugars - not checking at home Denies: Polyuria, polydipsia, vision changes, neuropathy, hypoglycemia Recent pertinent labs: Lab Results  Component Value Date   HGBA1C 8.3 (A) 12/21/2022   HGBA1C 7.1 (H) 08/24/2022   HGBA1C 7.1 (H) 12/29/2021   Lab Results  Component Value Date   MICROALBUR 0.3 08/24/2022   LDLCALC 122 (H) 08/24/2022   CREATININE 0.81 08/24/2022   Standard of care and health maintenance: Urine Microalbumin:  recheck today with higher A1c Foot exam:  UTD- due oct DM eye exam:  UTD ACEI/ARB:  no - pt did not take after reporting SE Statin:  crestor  Significant amount of education today with pt and wife with help of interpreter Including more questions about lab work, diet/lifestyle efforts, and duration of needing meds I explained the goal is to keep the A1C <7.0 and medicines needed to achieve that may change over the years - if he is able to reduce meds  at any time we will help him do that  Offered DM education referral with RD, but pt and wife declined Multiple hand outs given to them today with more info about diabetes - know your numbers, reviewed diet/lifestyle efforts, fasting CBG range that would correlate to well controlled sugars/A1C  Plan to recheck labs - adjust meds if needed 3 month f/up if A1C is >7.0 - increase jardiance to 25 mg 4-6 month f/up if A1C is at goal - keep jardiance the same dose      Relevant Orders   COMPLETE METABOLIC PANEL WITH GFR   Hemoglobin A1c   Lipid panel   Microalbumin / creatinine urine ratio     Other   Hypercholesterolemia    on crestor for the last 6 months - but hx of med noncompliance - on and off meds, SE/sensitivities and running out often though refills are at the pharmacy He reports good med compliance and no SE or concerns for the past couple months He has worked on [employer], water Value Date   CHOL 222 (H) 08/24/2022   HDL 69 08/24/2022   LDLCALC 122 (H) 08/24/2022   TRIG 194 (H) 08/24/2022   CHOLHDL 3.2 08/24/2022  - Denies: Chest pain, shortness of breath, myalgias, claudication Compliant with meds, no SE, no myalgias, fatigue or jaundice Diet and exercise recommendations for HLD reviewed        Relevant Orders   COMPLETE METABOLIC PANEL WITH GFR   Lipid panel   Medication nonadherence due to language barrier    Additional time today again spent educating pt, offering printed and verbal resources and info with help of interpreter in person today which was much improved visit compared to using the translator via telephone      Requires document translation service to support health  literacy    OV done with in person interpreter Much better than prior visits Pt was able to ask and answer more questions with help of in person interpreter We were able to reference labs and info on chart/computer with pt in the room and they had ample opportunity to  review and ask questions        They filled out new DPR to allow [place] to call daughter with results and leave VM with results if she is unable to answer  Next visit will plan for lab work to be done 1 week prior to appt   Return in about 3 months (around 06/27/2023) for DM .   Danelle Berry, PA-C 03/22/23 11:12 AM

## 2023-03-23 ENCOUNTER — Ambulatory Visit: Payer: Self-pay | Admitting: *Deleted

## 2023-03-23 LAB — LIPID PANEL
Cholesterol: 150 mg/dL (ref <200)
HDL: 67 mg/dL (ref >=40)
LDL Cholesterol (Calc): 64 mg/dL (calc)
Non-HDL Cholesterol (Calc): 83 mg/dL (calc) (ref <130)
Total CHOL/HDL Ratio: 2.2 (calc) (ref <5.0)
Triglycerides: 100 mg/dL (ref <150)

## 2023-03-23 LAB — MICROALBUMIN / CREATININE URINE RATIO
Creatinine, Urine: 92 mg/dL (ref 20–320)
Microalb Creat Ratio: 7 mg/g creat (ref <30)
Microalb, Ur: 0.6 mg/dL

## 2023-03-23 LAB — COMPLETE METABOLIC PANEL WITH GFR
AG Ratio: 1.8 (calc) (ref 1.0–2.5)
ALT: 40 U/L (ref 9–46)
AST: 24 U/L (ref 10–35)
Albumin: 4.6 g/dL (ref 3.6–5.1)
Alkaline phosphatase (APISO): 61 U/L (ref 35–144)
BUN/Creatinine Ratio: 28 (calc) — ABNORMAL HIGH (ref 6–22)
BUN: 19 mg/dL (ref 7–25)
CO2: 27 mmol/L (ref 20–32)
Calcium: 9.4 mg/dL (ref 8.6–10.3)
Chloride: 106 mmol/L (ref 98–110)
Creat: 0.69 mg/dL — ABNORMAL LOW (ref 0.70–1.30)
Globulin: 2.5 g/dL (calc) (ref 1.9–3.7)
Glucose, Bld: 113 mg/dL — ABNORMAL HIGH (ref 65–99)
Potassium: 4.6 mmol/L (ref 3.5–5.3)
Sodium: 140 mmol/L (ref 135–146)
Total Bilirubin: 0.7 mg/dL (ref 0.2–1.2)
Total Protein: 7.1 g/dL (ref 6.1–8.1)
eGFR: 108 mL/min/{1.73_m2} (ref >=60)

## 2023-03-23 LAB — HEMOGLOBIN A1C
Hgb A1c MFr Bld: 7 % of total Hgb — ABNORMAL HIGH (ref <5.7)
Mean Plasma Glucose: 154 mg/dL
eAG (mmol/L): 8.5 mmol/L

## 2023-03-23 NOTE — Telephone Encounter (Signed)
We will call him and send copy once they have been reviewed.

## 2023-03-23 NOTE — Telephone Encounter (Signed)
Interpreter ID # [id number] Chief Complaint: requesting lab results Symptoms: none  Frequency: na Pertinent Negatives: Patient denies na Disposition: [] ED /[] Urgent Care (no appt availability in office) / [] Appointment(In office/virtual)/ []  [facility]/ [] Home Care/ [] Refused Recommended Disposition /[] [facility]/ na Follow-up with PCP Additional Notes:   No issues with blood sugars. Requesting lab results. In review of chart no documentation noted from PCP at this time. Patient requesting if lab results can be sent to address verified in chart.       Reason for Disposition  [1] Follow-up call to recent contact AND [2] information only call, no triage required  Answer Assessment - Initial Assessment Questions 1. REASON FOR CALL or QUESTION: "What is your reason for calling today?" or "How can I best help you?" or "What question do you have that I can help answer?"     Requesting lab results only  Protocols used: Information Only Call - No Triage-A-AH

## 2023-03-23 NOTE — Telephone Encounter (Signed)
ummary: Pt has concerns about blood sugar level   Pt has concerns about blood sugar level and would like to discuss. Cb# [phone]      Called patient via interpreter ID # [id number] Mandarin, on # [phone] to review questions regarding blood sugar. No answer, no VM , not able to leave message.

## 2023-03-24 ENCOUNTER — Other Ambulatory Visit: Payer: Self-pay

## 2023-03-24 DIAGNOSIS — E119 Type 2 diabetes mellitus without complications: Secondary | ICD-10-CM

## 2023-06-21 ENCOUNTER — Other Ambulatory Visit: Payer: Self-pay | Admitting: Family Medicine

## 2023-06-21 DIAGNOSIS — E119 Type 2 diabetes mellitus without complications: Secondary | ICD-10-CM | POA: Diagnosis not present

## 2023-06-28 ENCOUNTER — Encounter: Payer: Self-pay | Admitting: Family Medicine

## 2023-06-28 ENCOUNTER — Ambulatory Visit (INDEPENDENT_AMBULATORY_CARE_PROVIDER_SITE_OTHER): Payer: 59 | Admitting: Family Medicine

## 2023-06-28 VITALS — BP 132/72 | HR 61 | Temp 97.7°F | Resp 16 | Ht 65.0 in | Wt 141.1 lb

## 2023-06-28 DIAGNOSIS — R002 Palpitations: Secondary | ICD-10-CM | POA: Diagnosis not present

## 2023-06-28 DIAGNOSIS — Z7984 Long term (current) use of oral hypoglycemic drugs: Secondary | ICD-10-CM

## 2023-06-28 DIAGNOSIS — I10 Essential (primary) hypertension: Secondary | ICD-10-CM | POA: Diagnosis not present

## 2023-06-28 DIAGNOSIS — E7800 Pure hypercholesterolemia, unspecified: Secondary | ICD-10-CM | POA: Diagnosis not present

## 2023-06-28 DIAGNOSIS — E119 Type 2 diabetes mellitus without complications: Secondary | ICD-10-CM | POA: Diagnosis not present

## 2023-06-28 NOTE — Assessment & Plan Note (Signed)
Was controlled with last labs, pt has been encouraged to continue his medication and we will check annually

## 2023-06-28 NOTE — Progress Notes (Unsigned)
Name: Corey Norman   MRN: [id number]    DOB: 1966-10-11   Date:06/28/2023       Progress Note  Chief Complaint  Patient presents with   Follow-up   Diabetes     Subjective:   Corey Norman is a 57 y.o. male, presents to clinic for routine f/up  HTN not on meds, has been rx'd meds before but not taken or tolerated them (acei/arb) BP Readings from Last 3 Encounters:  06/28/23 (!) 140/74  03/22/23 118/74  01/25/23 130/78     DM:   Pt managing DM with jardiance 10 mg  Reports good med compliance Pt has no SE from meds. Denies: Polyuria, polydipsia, vision changes, neuropathy, hypoglycemia Recent pertinent labs: Lab Results  Component Value Date   HGBA1C 7.0 (H) 03/22/2023   HGBA1C 8.3 (A) 12/21/2022   HGBA1C 7.1 (H) 08/24/2022   Lab Results  Component Value Date   MICROALBUR 0.6 03/22/2023   LDLCALC 64 03/22/2023   CREATININE 0.71 06/21/2023   Standard of care and health maintenance: Urine Microalbumin:  done Foot exam:  done DM eye exam:  done ACEI/ARB:  no - did not tolerate Statin:  yes  Labs done one week ago - unfortunately only the CMP was done even though cmp and A1C were ordered -      Current Outpatient Medications:    empagliflozin (JARDIANCE) 10 MG TABS tablet, Take 1 tablet (10 mg total) by mouth daily before breakfast., Disp: 30 tablet, Rfl: 5   rosuvastatin (CRESTOR) 5 MG tablet, Take 1 tablet (5 mg total) by mouth daily., Disp: 30 tablet, Rfl: 5  Patient Active Problem List   Diagnosis Date Noted   Medication nonadherence due to language barrier 01/25/2023   Requires document translation service to support health literacy 01/25/2023   Diabetes mellitus without complication (HCC) 02/10/2021   Hypercholesterolemia 06/07/2017   Vitamin D insufficiency 06/07/2017    No past surgical history on file.  Family History  Problem Relation Age of Onset   Hypertension Mother     Social History   Tobacco Use   Smoking status: Never   Smokeless  tobacco: Never  Substance Use Topics   Alcohol use: Yes    Alcohol/week: 0.0 standard drinks of alcohol    Comment: wine per night   Drug use: No     No Known Allergies  Health Maintenance  Topic Date Due   COVID-19 Vaccine (4 - 2023-24 season) 07/14/2023 (Originally 07/23/2022)   Colonoscopy  03/21/2024 (Originally 09/13/2022)   FOOT EXAM  08/25/2023   HEMOGLOBIN A1C  09/21/2023   OPHTHALMOLOGY EXAM  10/06/2023   Diabetic kidney evaluation - Urine ACR  03/21/2024   Diabetic kidney evaluation - eGFR measurement  06/20/2024   Hepatitis C Screening  Completed   HIV Screening  Completed   HPV VACCINES  Aged Out   DTaP/Tdap/Td  Discontinued   INFLUENZA VACCINE  Discontinued   Zoster Vaccines- Shingrix  Discontinued    Chart Review Today: I personally reviewed active problem list, medication list, allergies, family history, social history, health maintenance, notes from last encounter, lab results, imaging with the patient/caregiver today.   Review of Systems  Constitutional: Negative.   HENT: Negative.    Eyes: Negative.   Respiratory: Negative.    Cardiovascular: Negative.   Gastrointestinal: Negative.   Endocrine: Negative.   Genitourinary: Negative.   Musculoskeletal: Negative.   Skin: Negative.   Allergic/Immunologic: Negative.   Neurological: Negative.   Hematological: Negative.  Psychiatric/Behavioral: Negative.    All other systems reviewed and are negative.    Objective:   Vitals:   06/28/23 1022 06/28/23 1106  BP: (!) 140/74 132/72  Pulse: 61   Resp: 16   Temp: 97.7 F (36.5 C)   TempSrc: Oral   SpO2: 99%   Weight: 141 lb 1.6 oz (64 kg)   Height: 5\' 5"  (1.651 m)     Body mass index is 23.48 kg/m.  Physical Exam Vitals and nursing note reviewed.  Constitutional:      Appearance: He is well-developed.  HENT:     Head: Normocephalic and atraumatic.     Nose: Nose normal.  Eyes:     General:        Right eye: No discharge.        Left eye:  No discharge.     Conjunctiva/sclera: Conjunctivae normal.  Neck:     Trachea: No tracheal deviation.  Cardiovascular:     Rate and Rhythm: Normal rate and regular rhythm.     Pulses: Normal pulses.     Heart sounds: Normal heart sounds.  Pulmonary:     Effort: Pulmonary effort is normal. No respiratory distress.     Breath sounds: No stridor.  Musculoskeletal:        General: Normal range of motion.  Skin:    General: Skin is warm and dry.     Findings: No rash.  Neurological:     Mental Status: He is alert.     Motor: No abnormal muscle tone.     Coordination: Coordination normal.  Psychiatric:        Behavior: Behavior normal.         Assessment & Plan:   1. Diabetes mellitus without complication (HCC) Lab Results  Component Value Date   HGBA1C 7.0 (H) 03/22/2023  Well controlled on jardiance Check A1C again in 3 months Continue jardiance and diet/lifestyle efforts - COMPLETE METABOLIC PANEL WITH GFR - Hemoglobin A1c  2. Hypercholesterolemia Lipids done last OV and were improved with better statin compliance Lab Results  Component Value Date   CHOL 150 03/22/2023   HDL 67 03/22/2023   LDLCALC 64 03/22/2023   TRIG 100 03/22/2023   CHOLHDL 2.2 03/22/2023  Pt encouraged to continue taking statin daily   3. Palpitations Episodes of palpitations when he drinks coffee, none currently in exam room Heart - RRR Recommended that he avoid stimulants and caffeine, he is also encouraged to come into clinic while symptomatic so we can evaluate or do an EKG  4. Primary hypertension Blood pressure was a little elevated today but did improve with recheck and is near goal We have tried blood pressure medications in the past but he has endorsed side effects palpitations dizziness, and his very resistant to taking medications or adding medications we will continue to monitor for now but it has been previously explained that an ACE or an ARB would help provide renal  protection with his diagnosis of type 2 diabetes     Return in about 3 months (around 09/28/2023) for Routine follow-up.   Danelle Berry, PA-C 06/28/23 10:43 AM

## 2023-06-28 NOTE — Assessment & Plan Note (Signed)
Last labs A1C controlled on jardiance 10 Lab Results  Component Value Date   HGBA1C 7.0 (H) 03/22/2023  Will recheck labs October, for now continue jardiance

## 2023-06-28 NOTE — Patient Instructions (Signed)
Results for orders placed or performed in visit on 06/21/23  COMPLETE METABOLIC PANEL WITH GFR  Result Value Ref Range   Glucose, Bld 117 (H) 65 - 99 mg/dL   BUN 20 7 - 25 mg/dL   Creat 9.56 2.13 - 0.86 mg/dL   eGFR 578 > OR = 60 IO/NGE/9.52W4   BUN/Creatinine Ratio SEE NOTE: 6 - 22 (calc)   Sodium 140 135 - 146 mmol/L   Potassium 4.5 3.5 - 5.3 mmol/L   Chloride 106 98 - 110 mmol/L   CO2 24 20 - 32 mmol/L   Calcium 9.3 8.6 - 10.3 mg/dL   Total Protein 6.9 6.1 - 8.1 g/dL   Albumin 4.7 3.6 - 5.1 g/dL   Globulin 2.2 1.9 - 3.7 g/dL (calc)   AG Ratio 2.1 1.0 - 2.5 (calc)   Total Bilirubin 0.6 0.2 - 1.2 mg/dL   Alkaline phosphatase (APISO) 69 35 - 144 U/L   AST 20 10 - 35 U/L   ALT 24 9 - 46 U/L   Lab Results  Component Value Date   HGBA1C 7.0 (H) 03/22/2023

## 2023-09-19 ENCOUNTER — Other Ambulatory Visit: Payer: Self-pay | Admitting: Family Medicine

## 2023-09-19 DIAGNOSIS — E119 Type 2 diabetes mellitus without complications: Secondary | ICD-10-CM

## 2023-09-19 NOTE — Telephone Encounter (Signed)
Medication Refill - Medication: empagliflozin (JARDIANCE) 10 MG TABS tablet   Pt is out his current supply  Has the patient contacted their pharmacy? No. (Agent: If no, request that the patient contact the pharmacy for the refill. If patient does not wish to contact the pharmacy document the reason why and proceed with request.) (Agent: If yes, when and what did the pharmacy advise?)  Preferred Pharmacy (with phone number or street name):  [id number] [place], [place] - [address]  [address] [place] [address]  Phone: [phone] Fax: [phone]   Has the patient been seen for an appointment in the last year OR does the patient have an upcoming appointment? Yes.    Agent: Please be advised that RX refills may take up to 3 business days. We ask that you follow-up with your pharmacy.

## 2023-09-20 ENCOUNTER — Other Ambulatory Visit: Payer: Self-pay

## 2023-09-20 DIAGNOSIS — E119 Type 2 diabetes mellitus without complications: Secondary | ICD-10-CM

## 2023-09-20 MED ORDER — EMPAGLIFLOZIN 10 MG PO TABS
10.0000 mg | ORAL_TABLET | Freq: Every day | ORAL | 5 refills | Status: DC
Start: 2023-09-20 — End: 2024-04-03

## 2023-10-04 ENCOUNTER — Ambulatory Visit (INDEPENDENT_AMBULATORY_CARE_PROVIDER_SITE_OTHER): Payer: 59 | Admitting: Physician Assistant

## 2023-10-04 ENCOUNTER — Encounter: Payer: Self-pay | Admitting: Physician Assistant

## 2023-10-04 VITALS — BP 122/74 | HR 65 | Temp 97.6°F | Resp 16 | Ht 65.0 in | Wt 136.1 lb

## 2023-10-04 DIAGNOSIS — H538 Other visual disturbances: Secondary | ICD-10-CM

## 2023-10-04 DIAGNOSIS — Z7984 Long term (current) use of oral hypoglycemic drugs: Secondary | ICD-10-CM | POA: Diagnosis not present

## 2023-10-04 DIAGNOSIS — E559 Vitamin D deficiency, unspecified: Secondary | ICD-10-CM

## 2023-10-04 DIAGNOSIS — E7800 Pure hypercholesterolemia, unspecified: Secondary | ICD-10-CM

## 2023-10-04 DIAGNOSIS — E119 Type 2 diabetes mellitus without complications: Secondary | ICD-10-CM

## 2023-10-04 NOTE — Assessment & Plan Note (Signed)
Chronic, historic condition Appears to be well controlled with current regimen comprised of Rosuvastatin 5 mg pO every day Continue current regimen for now Recheck lipid panel today- Results to dictate further management  Follow up in 6 months or sooner if concerns arise

## 2023-10-04 NOTE — Assessment & Plan Note (Signed)
Chronic, ongoing Appears to be tolerating current regimen well Continue Jardiance 10 mg po every day for now Recheck A1c today  Results to dictate further management  Follow up in 3 months or sooner if concerns arise

## 2023-10-04 NOTE — Progress Notes (Signed)
Established Patient Office Visit  Name: Corey Norman   MRN: [id number]    DOB: 1966-06-09   Date:10/04/2023  Today's Provider: Jacquelin Hawking, MHS, PA-C Introduced myself to the patient as a PA-C and provided education on APPs in clinical practice.         Subjective  Chief Complaint  Chief Complaint  Patient presents with   Diabetes   Hyperlipidemia    He has access to translation services with remote interpretation, translator: Stefanie # [phone]   HPI  Diabetes, Type 2 - Last A1c 7.0% - Medications: Jardiance 10 mg PO QD - Compliance: good  - Checking BG at home: not checking at home  - Diet: He is trying to follow low sugar diet  - Exercise: Not engaged in regular exercise  - Eye exam: referral placed for ophthalmology  - Foot exam: overdue- completed today  - Microalbumin: UTD - Statin: on statin - PNA vaccine: NA  He reports he has been having blurry vision and there is a line across his vision for the past few days He denies eye pain  He denies vision loss  Reports he would like to go back to [facility]   HYPERLIPIDEMIA Hyperlipidemia status: managed on statin Satisfied with current treatment?  yes Side effects:  no Medication compliance: good compliance Past cholesterol meds: rosuvastatin (crestor) Supplements: none Aspirin:  no The 10-year ASCVD risk score (Arnett DK, et al., 2019) is: 7.6%   Values used to calculate the score:     Age: 57 years     Sex: Male     Is Non-Hispanic African American: No     Diabetic: Yes     Tobacco smoker: No     Systolic Blood Pressure: 122 mmHg     Is BP treated: No     HDL Cholesterol: 67 mg/dL     Total Cholesterol: 150 mg/dL Chest pain:  no    Patient Active Problem List   Diagnosis Date Noted   Medication nonadherence due to language barrier 01/25/2023   Requires document translation service to support health literacy 01/25/2023   Diabetes mellitus without complication (HCC) 02/10/2021    Hypercholesterolemia 06/07/2017   Vitamin D insufficiency 06/07/2017    No past surgical history on file.  Family History  Problem Relation Age of Onset   Hypertension Mother     Social History   Tobacco Use   Smoking status: Never   Smokeless tobacco: Never  Substance Use Topics   Alcohol use: Yes    Alcohol/week: 0.0 standard drinks of alcohol    Comment: wine per night     Current Outpatient Medications:    empagliflozin (JARDIANCE) 10 MG TABS tablet, Take 1 tablet (10 mg total) by mouth daily before breakfast., Disp: 30 tablet, Rfl: 5   rosuvastatin (CRESTOR) 5 MG tablet, Take 1 tablet (5 mg total) by mouth daily., Disp: 30 tablet, Rfl: 5  No Known Allergies  I personally reviewed active problem list, medication list, allergies, health maintenance, notes from last encounter, lab results with the patient/caregiver today.   Review of Systems  Eyes:  Positive for blurred vision.  Respiratory:  Negative for shortness of breath and wheezing.   Cardiovascular:  Negative for chest pain, palpitations and leg swelling.  Gastrointestinal:  Negative for constipation, diarrhea, nausea and vomiting.  Genitourinary:  Negative for dysuria and frequency.  Neurological:  Negative for dizziness, tingling and headaches.  Objective  Vitals:   10/04/23 1049  BP: 122/74  Pulse: 65  Resp: 16  Temp: 97.6 F (36.4 C)  TempSrc: Oral  SpO2: 99%  Weight: 136 lb 1.6 oz (61.7 kg)  Height: 5\' 5"  (1.651 m)    Body mass index is 22.65 kg/m.  Physical Exam Vitals reviewed.  Constitutional:      General: He is awake.     Appearance: Normal appearance. He is well-developed and well-groomed.  HENT:     Head: Normocephalic and atraumatic.  Eyes:     General: Lids are normal. Gaze aligned appropriately. No allergic shiner or scleral icterus.       Right eye: No foreign body, discharge or hordeolum.        Left eye: No foreign body, discharge or hordeolum.     Extraocular  Movements: Extraocular movements intact.     Conjunctiva/sclera: Conjunctivae normal.     Pupils: Pupils are equal, round, and reactive to light.  Cardiovascular:     Rate and Rhythm: Normal rate and regular rhythm.     Pulses: Normal pulses.          Radial pulses are 2+ on the right side and 2+ on the left side.     Heart sounds: Normal heart sounds. No murmur heard.    No friction rub. No gallop.  Pulmonary:     Effort: Pulmonary effort is normal.     Breath sounds: Normal breath sounds. No decreased air movement. No decreased breath sounds, wheezing, rhonchi or rales.  Musculoskeletal:     Cervical back: Normal range of motion.     Right lower leg: No edema.     Left lower leg: No edema.  Neurological:     General: No focal deficit present.     Mental Status: He is alert and oriented to person, place, and time. Mental status is at baseline.     GCS: GCS eye subscore is 4. GCS verbal subscore is 5. GCS motor subscore is 6.  Psychiatric:        Attention and Perception: Attention and perception normal.        Mood and Affect: Mood and affect normal.        Speech: Speech normal.        Behavior: Behavior normal. Behavior is cooperative.        Thought Content: Thought content normal.        Cognition and Memory: Cognition normal.      No results found for this or any previous visit (from the past 2160 hour(s)).   PHQ2/9:    10/04/2023   10:43 AM 06/28/2023   10:22 AM 03/22/2023   10:26 AM 01/25/2023   10:17 AM 12/21/2022   11:52 AM  Depression screen PHQ 2/9  Decreased Interest 0 0 0 0 0  Down, Depressed, Hopeless 0 0 0 0 0  PHQ - 2 Score 0 0 0 0 0  Altered sleeping 0 0 0 0 0  Tired, decreased energy 0 0 0 0 0  Change in appetite 0 0 0 0 0  Feeling bad or failure about yourself  0 0 0 0 0  Trouble concentrating 0 0 0 0 0  Moving slowly or fidgety/restless 0 0 0 0 0  Suicidal thoughts 0 0 0 0 0  PHQ-9 Score 0 0 0 0 0  Difficult doing work/chores Not difficult at all  Not difficult at all Not difficult at all Not difficult at all Not difficult  at all      Fall Risk:    10/04/2023   10:43 AM 06/28/2023   10:22 AM 03/22/2023   10:25 AM 01/25/2023   10:16 AM 12/21/2022   11:52 AM  Fall Risk   Falls in the past year? 0 0 0 0 0  Number falls in past yr: 0 0 0 0 0  Injury with Fall? 0 0 0 0 0  Risk for fall due to : No Fall Risks No Fall Risks No Fall Risks No Fall Risks   Follow up Falls prevention discussed;Education provided;Falls evaluation completed Falls prevention discussed;Education provided;Falls evaluation completed Falls prevention discussed;Education provided;Falls evaluation completed Falls prevention discussed;Education provided;Falls evaluation completed       Functional Status Survey: Is the patient deaf or have difficulty hearing?: No Does the patient have difficulty seeing, even when wearing glasses/contacts?: No Does the patient have difficulty concentrating, remembering, or making decisions?: No Does the patient have difficulty walking or climbing stairs?: No Does the patient have difficulty dressing or bathing?: No Does the patient have difficulty doing errands alone such as visiting a doctor's office or shopping?: No    Assessment & Plan  Problem List Items Addressed This Visit       Endocrine   Diabetes mellitus without complication (HCC) - Primary    Chronic, ongoing Appears to be tolerating current regimen well Continue Jardiance 10 mg po every day for now Recheck A1c today  Results to dictate further management  Follow up in 3 months or sooner if concerns arise        Relevant Orders   HM DIABETES FOOT EXAM (Completed)   COMPLETE METABOLIC PANEL WITH GFR   HgB W0J   CBC w/Diff/Platelet     Other   Hypercholesterolemia    Chronic, historic condition Appears to be well controlled with current regimen comprised of Rosuvastatin 5 mg pO every day Continue current regimen for now Recheck lipid panel today- Results  to dictate further management  Follow up in 6 months or sooner if concerns arise        Relevant Orders   Lipid Profile   Vitamin D insufficiency    Recheck labs Results to dictate further management        Relevant Orders   Vitamin D (25 hydroxy)   Other Visit Diagnoses     Blurry vision, left eye       Relevant Orders   Ambulatory referral to Ophthalmology     Acute, new concern Patient reports new onset of blurry vision and feeling like there is a "line or hair" across vision He denies loss of vision or eye pain at this time Physical exam is overall normal today in office Recommend he follow up with ophthalmology- plans to make apt with [facility] in the coming days Reviewed ED and return precautions- vision loss, eye pain, swelling Follow up as needed for persistent or progressing symptoms     Return in about 3 months (around 01/04/2024) for DM, HLD.   I, Jeffree Cazeau E Delva Derden, PA-C, have reviewed all documentation for this visit. The documentation on 10/04/23 for the exam, diagnosis, procedures, and orders are all accurate and complete.   Jacquelin Hawking, MHS, PA-C [facility]

## 2023-10-04 NOTE — Assessment & Plan Note (Signed)
Recheck labs Results to dictate further management  

## 2023-10-05 LAB — LIPID PANEL
Cholesterol: 158 mg/dL (ref <200)
HDL: 80 mg/dL (ref >=40)
LDL Cholesterol (Calc): 60 mg/dL
Non-HDL Cholesterol (Calc): 78 mg/dL (ref <130)
Total CHOL/HDL Ratio: 2 (calc) (ref <5.0)
Triglycerides: 101 mg/dL (ref <150)

## 2023-10-05 LAB — CBC WITH DIFFERENTIAL/PLATELET
Absolute Lymphocytes: 1421 {cells}/uL (ref 850–3900)
Absolute Monocytes: 221 {cells}/uL (ref 200–950)
Basophils Absolute: 31 {cells}/uL (ref 0–200)
Basophils Relative: 0.9 %
Eosinophils Absolute: 20 {cells}/uL (ref 15–500)
Eosinophils Relative: 0.6 %
HCT: 47.8 % (ref 38.5–50.0)
Hemoglobin: 15.7 g/dL (ref 13.2–17.1)
MCH: 31.2 pg (ref 27.0–33.0)
MCHC: 32.8 g/dL (ref 32.0–36.0)
MCV: 94.8 fL (ref 80.0–100.0)
MPV: 10.4 fL (ref 7.5–12.5)
Monocytes Relative: 6.5 %
Neutro Abs: 1707 {cells}/uL (ref 1500–7800)
Neutrophils Relative %: 50.2 %
Platelets: 213 10*3/uL (ref 140–400)
RBC: 5.04 10*6/uL (ref 4.20–5.80)
RDW: 11.9 % (ref 11.0–15.0)
Total Lymphocyte: 41.8 %
WBC: 3.4 10*3/uL — ABNORMAL LOW (ref 3.8–10.8)

## 2023-10-05 LAB — COMPLETE METABOLIC PANEL WITH GFR
AG Ratio: 2 (calc) (ref 1.0–2.5)
ALT: 26 U/L (ref 9–46)
AST: 21 U/L (ref 10–35)
Albumin: 4.9 g/dL (ref 3.6–5.1)
Alkaline phosphatase (APISO): 69 U/L (ref 35–144)
BUN/Creatinine Ratio: 23 (calc) — ABNORMAL HIGH (ref 6–22)
BUN: 16 mg/dL (ref 7–25)
CO2: 27 mmol/L (ref 20–32)
Calcium: 9.9 mg/dL (ref 8.6–10.3)
Chloride: 104 mmol/L (ref 98–110)
Creat: 0.69 mg/dL — ABNORMAL LOW (ref 0.70–1.30)
Globulin: 2.4 g/dL (ref 1.9–3.7)
Glucose, Bld: 153 mg/dL — ABNORMAL HIGH (ref 65–99)
Potassium: 5.2 mmol/L (ref 3.5–5.3)
Sodium: 139 mmol/L (ref 135–146)
Total Bilirubin: 0.9 mg/dL (ref 0.2–1.2)
Total Protein: 7.3 g/dL (ref 6.1–8.1)
eGFR: 107 mL/min/{1.73_m2} (ref >=60)

## 2023-10-05 LAB — HEMOGLOBIN A1C
Hgb A1c MFr Bld: 6.9 %{Hb} — ABNORMAL HIGH (ref <5.7)
Mean Plasma Glucose: 151 mg/dL
eAG (mmol/L): 8.4 mmol/L

## 2023-10-05 LAB — VITAMIN D 25 HYDROXY (VIT D DEFICIENCY, FRACTURES): Vit D, 25-Hydroxy: 32 ng/mL (ref 30–100)

## 2023-10-07 ENCOUNTER — Telehealth: Payer: Self-pay | Admitting: Physician Assistant

## 2023-10-07 NOTE — Telephone Encounter (Signed)
Pt came in asking for copy of his labs but the dr has not read them yet. He is asking for a copy of the results once the dr had reviewed them sent to his house.  Thanks

## 2023-10-07 NOTE — Progress Notes (Signed)
Your labs are back  Your electrolytes, liver and kidney function were overall normal Your A1c was 6.9 % which shows continued improvement from your previous results- please continue your current medications  as directed Your CBC was overall normal- no signs of anemia Your cholesterol looks great  Your Vitamin D is in normal range but is a bit low- I recommend supplementing with [phone] units per day and we can recheck this at your next apt for monitoring  Please let us know if you have further questions or concerns

## 2023-11-29 LAB — HM DIABETES EYE EXAM

## 2024-01-03 ENCOUNTER — Encounter: Payer: Self-pay | Admitting: Family Medicine

## 2024-01-03 ENCOUNTER — Ambulatory Visit: Payer: Self-pay | Admitting: Family Medicine

## 2024-01-03 VITALS — BP 122/68 | HR 71 | Resp 16 | Ht 65.0 in | Wt 140.0 lb

## 2024-01-03 DIAGNOSIS — E119 Type 2 diabetes mellitus without complications: Secondary | ICD-10-CM

## 2024-01-03 DIAGNOSIS — E559 Vitamin D deficiency, unspecified: Secondary | ICD-10-CM | POA: Diagnosis not present

## 2024-01-03 DIAGNOSIS — E7800 Pure hypercholesterolemia, unspecified: Secondary | ICD-10-CM | POA: Diagnosis not present

## 2024-01-03 DIAGNOSIS — E1169 Type 2 diabetes mellitus with other specified complication: Secondary | ICD-10-CM | POA: Diagnosis not present

## 2024-01-03 DIAGNOSIS — Z7984 Long term (current) use of oral hypoglycemic drugs: Secondary | ICD-10-CM

## 2024-01-03 MED ORDER — ROSUVASTATIN CALCIUM 5 MG PO TABS
5.0000 mg | ORAL_TABLET | Freq: Every day | ORAL | 8 refills | Status: DC
Start: 2024-01-03 — End: 2024-10-09

## 2024-01-03 NOTE — Patient Instructions (Addendum)
Results for orders placed or performed in visit on 10/04/23  COMPLETE METABOLIC PANEL WITH GFR   Collection Time: 10/04/23 11:29 AM  Result Value Ref Range   Glucose, Bld 153 (H) 65 - 99 mg/dL   BUN 16 7 - 25 mg/dL   Creat 8.65 (L) 7.84 - 1.30 mg/dL   eGFR 696 > OR = 60 EX/BMW/4.13K4   BUN/Creatinine Ratio 23 (H) 6 - 22 (calc)   Sodium 139 135 - 146 mmol/L   Potassium 5.2 3.5 - 5.3 mmol/L   Chloride 104 98 - 110 mmol/L   CO2 27 20 - 32 mmol/L   Calcium 9.9 8.6 - 10.3 mg/dL   Total Protein 7.3 6.1 - 8.1 g/dL   Albumin 4.9 3.6 - 5.1 g/dL   Globulin 2.4 1.9 - 3.7 g/dL (calc)   AG Ratio 2.0 1.0 - 2.5 (calc)   Total Bilirubin 0.9 0.2 - 1.2 mg/dL   Alkaline phosphatase (APISO) 69 35 - 144 U/L   AST 21 10 - 35 U/L   ALT 26 9 - 46 U/L  HgB A1c   Collection Time: 10/04/23 11:29 AM  Result Value Ref Range   Hgb A1c MFr Bld 6.9 (H) <5.7 % of total Hgb   Mean Plasma Glucose 151 mg/dL   eAG (mmol/L) 8.4 mmol/L  CBC w/Diff/Platelet   Collection Time: 10/04/23 11:29 AM  Result Value Ref Range   WBC 3.4 (L) 3.8 - 10.8 Thousand/uL   RBC 5.04 4.20 - 5.80 Million/uL   Hemoglobin 15.7 13.2 - 17.1 g/dL   HCT 40.1 02.7 - 25.3 %   MCV 94.8 80.0 - 100.0 fL   MCH 31.2 27.0 - 33.0 pg   MCHC 32.8 32.0 - 36.0 g/dL   RDW 66.4 40.3 - 47.4 %   Platelets 213 140 - 400 Thousand/uL   MPV 10.4 7.5 - 12.5 fL   Neutro Abs 1,707 1,500 - 7,800 cells/uL   Absolute Lymphocytes 1,421 850 - 3,900 cells/uL   Absolute Monocytes 221 200 - 950 cells/uL   Eosinophils Absolute 20 15 - 500 cells/uL   Basophils Absolute 31 0 - 200 cells/uL   Neutrophils Relative % 50.2 %   Total Lymphocyte 41.8 %   Monocytes Relative 6.5 %   Eosinophils Relative 0.6 %   Basophils Relative 0.9 %  Lipid Profile   Collection Time: 10/04/23 11:29 AM  Result Value Ref Range   Cholesterol 158 <200 mg/dL   HDL 80 > OR = 40 mg/dL   Triglycerides 259 <563 mg/dL   LDL Cholesterol (Calc) 60 mg/dL (calc)   Total CHOL/HDL Ratio 2.0 <5.0  (calc)   Non-HDL Cholesterol (Calc) 78 <875 mg/dL (calc)  VITAMIN D 25 Hydroxy (Vit-D Deficiency, Fractures)   Collection Time: 10/04/23 11:29 AM  Result Value Ref Range   Vit D, 25-Hydroxy 32 30 - 100 ng/mL    Vit D was better  Cholesterol was still controlled, keep taking the crestor once a day  Diabetes was well controlled and a little better than last labs.  Continue to eat a healthy diet, limit carbs and sweets and continue to take Jardiance.  We will check your labs in 3 months.

## 2024-01-03 NOTE — Assessment & Plan Note (Signed)
Has been stable and well controlled Continue statin and diet/lifestyle efforts Again explained to pt that we can monitor lipid panel once annually  Lab Results  Component Value Date   CHOL 158 10/04/2023   HDL 80 10/04/2023   LDLCALC 60 10/04/2023   TRIG 101 10/04/2023   CHOLHDL 2.0 10/04/2023

## 2024-01-03 NOTE — Progress Notes (Signed)
Name: Corey Norman   MRN: [id number]    DOB: 23-Aug-1966   Date:01/03/2024       Progress Note  Chief Complaint  Patient presents with   Medical Management of Chronic Issues    3 months   Diabetes     Subjective:   Corey Norman is a 58 y.o. male, presents to clinic for routine follow up on chronic conditions  Seen by me 6 months ago, and again by float PA 3 months ago with complete set of labs  Pt on statin with labs done twice in the past several months, no meds SE, lipids controlled Lab Results  Component Value Date   CHOL 158 10/04/2023   CHOL 150 03/22/2023   CHOL 222 (H) 08/24/2022   Lab Results  Component Value Date   HDL 80 10/04/2023   HDL 67 03/22/2023   HDL 69 08/24/2022   Lab Results  Component Value Date   LDLCALC 60 10/04/2023   LDLCALC 64 03/22/2023   LDLCALC 122 (H) 08/24/2022   Lab Results  Component Value Date   TRIG 101 10/04/2023   TRIG 100 03/22/2023   TRIG 194 (H) 08/24/2022   Lab Results  Component Value Date   CHOLHDL 2.0 10/04/2023   CHOLHDL 2.2 03/22/2023   CHOLHDL 3.2 08/24/2022    DM - improved and became controlled with better med compliance, pt on jardiance He tried ECTI/ARB previously and did not tolerate with a variety of sx/SE Lab Results  Component Value Date   HGBA1C 6.9 (H) 10/04/2023      Current Outpatient Medications:    empagliflozin (JARDIANCE) 10 MG TABS tablet, Take 1 tablet (10 mg total) by mouth daily before breakfast., Disp: 30 tablet, Rfl: 5   rosuvastatin (CRESTOR) 5 MG tablet, Take 1 tablet (5 mg total) by mouth daily., Disp: 30 tablet, Rfl: 8  Patient Active Problem List   Diagnosis Date Noted   Medication nonadherence due to language barrier 01/25/2023   Requires document translation service to support health literacy 01/25/2023   Diabetes mellitus without complication (HCC) 02/10/2021   Hypercholesterolemia 06/07/2017   Vitamin D insufficiency 06/07/2017    No past surgical history on file.  Family  History  Problem Relation Age of Onset   Hypertension Mother     Social History   Tobacco Use   Smoking status: Never   Smokeless tobacco: Never  Substance Use Topics   Alcohol use: Yes    Alcohol/week: 0.0 standard drinks of alcohol    Comment: wine per night   Drug use: No     No Known Allergies  Health Maintenance  Topic Date Due   OPHTHALMOLOGY EXAM  01/03/2024 (Originally 10/06/2023)   COVID-19 Vaccine (4 - 2024-25 season) 01/18/2024 (Originally 07/24/2023)   Colonoscopy  03/21/2024 (Originally 09/13/2022)   Pneumococcal Vaccine 24-28 Years old (1 of 2 - PCV) 01/01/2025 (Originally 08[birth year]29/1971)   Diabetic kidney evaluation - Urine ACR  03/21/2024   HEMOGLOBIN A1C  04/02/2024   Diabetic kidney evaluation - eGFR measurement  10/03/2024   FOOT EXAM  10/03/2024   Hepatitis C Screening  Completed   HIV Screening  Completed   HPV VACCINES  Aged Out   DTaP/Tdap/Td  Discontinued   INFLUENZA VACCINE  Discontinued   Zoster Vaccines- Shingrix  Discontinued    Chart Review Today: I personally reviewed active problem list, medication list, allergies, family history, social history, health maintenance, notes from last encounter, lab results, imaging with the patient/caregiver today.  Review of Systems  Constitutional: Negative.   HENT: Negative.    Eyes: Negative.   Respiratory: Negative.    Cardiovascular: Negative.   Gastrointestinal: Negative.   Endocrine: Negative.   Genitourinary: Negative.   Musculoskeletal: Negative.   Skin: Negative.   Allergic/Immunologic: Negative.   Neurological: Negative.   Hematological: Negative.   Psychiatric/Behavioral: Negative.    All other systems reviewed and are negative.    Objective:   Vitals:   01/03/24 1047  BP: 122/68  Pulse: 71  Resp: 16  SpO2: 100%  Weight: 140 lb (63.5 kg)  Height: 5\' 5"  (1.651 m)    Body mass index is 23.3 kg/m.  Physical Exam Vitals and nursing note reviewed.  Constitutional:       Appearance: Normal appearance. He is well-developed.  HENT:     Head: Normocephalic and atraumatic.     Nose: Nose normal.  Eyes:     General:        Right eye: No discharge.        Left eye: No discharge.     Conjunctiva/sclera: Conjunctivae normal.  Neck:     Trachea: No tracheal deviation.  Cardiovascular:     Rate and Rhythm: Normal rate and regular rhythm.     Pulses: Normal pulses.     Heart sounds: Normal heart sounds.  Pulmonary:     Effort: Pulmonary effort is normal. No respiratory distress.     Breath sounds: Normal breath sounds. No stridor. No wheezing, rhonchi or rales.  Skin:    General: Skin is warm and dry.     Findings: No rash.  Neurological:     Mental Status: He is alert.     Motor: No abnormal muscle tone.     Coordination: Coordination normal.  Psychiatric:        Mood and Affect: Mood normal.        Behavior: Behavior normal.        Results for orders placed or performed in visit on 10/04/23  COMPLETE METABOLIC PANEL WITH GFR   Collection Time: 10/04/23 11:29 AM  Result Value Ref Range   Glucose, Bld 153 (H) 65 - 99 mg/dL   BUN 16 7 - 25 mg/dL   Creat 4.09 (L) 8.11 - 1.30 mg/dL   eGFR 914 > OR = 60 NW/GNF/6.21H0   BUN/Creatinine Ratio 23 (H) 6 - 22 (calc)   Sodium 139 135 - 146 mmol/L   Potassium 5.2 3.5 - 5.3 mmol/L   Chloride 104 98 - 110 mmol/L   CO2 27 20 - 32 mmol/L   Calcium 9.9 8.6 - 10.3 mg/dL   Total Protein 7.3 6.1 - 8.1 g/dL   Albumin 4.9 3.6 - 5.1 g/dL   Globulin 2.4 1.9 - 3.7 g/dL (calc)   AG Ratio 2.0 1.0 - 2.5 (calc)   Total Bilirubin 0.9 0.2 - 1.2 mg/dL   Alkaline phosphatase (APISO) 69 35 - 144 U/L   AST 21 10 - 35 U/L   ALT 26 9 - 46 U/L  HgB A1c   Collection Time: 10/04/23 11:29 AM  Result Value Ref Range   Hgb A1c MFr Bld 6.9 (H) <5.7 % of total Hgb   Mean Plasma Glucose 151 mg/dL   eAG (mmol/L) 8.4 mmol/L  CBC w/Diff/Platelet   Collection Time: 10/04/23 11:29 AM  Result Value Ref Range   WBC 3.4 (L) 3.8 - 10.8  Thousand/uL   RBC 5.04 4.20 - 5.80 Million/uL   Hemoglobin 15.7 13.2 - 17.1  g/dL   HCT 16.1 09.6 - 04.5 %   MCV 94.8 80.0 - 100.0 fL   MCH 31.2 27.0 - 33.0 pg   MCHC 32.8 32.0 - 36.0 g/dL   RDW 40.9 81.1 - 91.4 %   Platelets 213 140 - 400 Thousand/uL   MPV 10.4 7.5 - 12.5 fL   Neutro Abs 1,707 1,500 - 7,800 cells/uL   Absolute Lymphocytes 1,421 850 - 3,900 cells/uL   Absolute Monocytes 221 200 - 950 cells/uL   Eosinophils Absolute 20 15 - 500 cells/uL   Basophils Absolute 31 0 - 200 cells/uL   Neutrophils Relative % 50.2 %   Total Lymphocyte 41.8 %   Monocytes Relative 6.5 %   Eosinophils Relative 0.6 %   Basophils Relative 0.9 %  Lipid Profile   Collection Time: 10/04/23 11:29 AM  Result Value Ref Range   Cholesterol 158 <200 mg/dL   HDL 80 > OR = 40 mg/dL   Triglycerides 782 <956 mg/dL   LDL Cholesterol (Calc) 60 mg/dL (calc)   Total CHOL/HDL Ratio 2.0 <5.0 (calc)   Non-HDL Cholesterol (Calc) 78 <213 mg/dL (calc)  VITAMIN D 25 Hydroxy (Vit-D Deficiency, Fractures)   Collection Time: 10/04/23 11:29 AM  Result Value Ref Range   Vit D, 25-Hydroxy 32 30 - 100 ng/mL      Assessment & Plan:   Hypercholesterolemia Assessment & Plan: Has been stable and well controlled Continue statin and diet/lifestyle efforts Again explained to pt that we can monitor lipid panel once annually  Lab Results  Component Value Date   CHOL 158 10/04/2023   HDL 80 10/04/2023   LDLCALC 60 10/04/2023   TRIG 101 10/04/2023   CHOLHDL 2.0 10/04/2023     Orders: -     Rosuvastatin Calcium; Take 1 tablet (5 mg total) by mouth daily.  Dispense: 30 tablet; Refill: 8  Diabetes mellitus without complication (HCC) Assessment & Plan: Last A1C at goal, on jardiance Everything up to date  Lab Results  Component Value Date   HGBA1C 6.9 (H) 10/04/2023   HGBA1C 7.0 (H) 03/22/2023   HGBA1C 8.3 (A) 12/21/2022   HGBA1C 7.1 (H) 08/24/2022   HGBA1C 7.1 (H) 12/29/2021   Continue jardiance and  diet/lifestyle efforts Will do labs in 3 months - explained to pt that when well controlled and on a medication like he has A1C can be checked every 6 months   Orders: -     Rosuvastatin Calcium; Take 1 tablet (5 mg total) by mouth daily.  Dispense: 30 tablet; Refill: 8  Vitamin D insufficiency Assessment & Plan: Vit D level had improved into normal range, was advised to take daily OTC supplement to maintain      Return for routine f/up DM in May 2025.   Danelle Berry, PA-C 01/03/24 11:19 AM

## 2024-01-03 NOTE — Assessment & Plan Note (Signed)
Last A1C at goal, on jardiance Everything up to date  Lab Results  Component Value Date   HGBA1C 6.9 (H) 10/04/2023   HGBA1C 7.0 (H) 03/22/2023   HGBA1C 8.3 (A) 12/21/2022   HGBA1C 7.1 (H) 08/24/2022   HGBA1C 7.1 (H) 12/29/2021   Continue jardiance and diet/lifestyle efforts Will do labs in 3 months - explained to pt that when well controlled and on a medication like he has A1C can be checked every 6 months

## 2024-01-03 NOTE — Assessment & Plan Note (Signed)
Vit D level had improved into normal range, was advised to take daily OTC supplement to maintain

## 2024-04-03 ENCOUNTER — Ambulatory Visit (INDEPENDENT_AMBULATORY_CARE_PROVIDER_SITE_OTHER): Payer: 59 | Admitting: Family Medicine

## 2024-04-03 ENCOUNTER — Encounter: Payer: Self-pay | Admitting: Family Medicine

## 2024-04-03 VITALS — BP 124/68 | HR 67 | Resp 16 | Ht 65.0 in | Wt 141.0 lb

## 2024-04-03 DIAGNOSIS — Z1211 Encounter for screening for malignant neoplasm of colon: Secondary | ICD-10-CM

## 2024-04-03 DIAGNOSIS — E119 Type 2 diabetes mellitus without complications: Secondary | ICD-10-CM

## 2024-04-03 DIAGNOSIS — Z7984 Long term (current) use of oral hypoglycemic drugs: Secondary | ICD-10-CM | POA: Diagnosis not present

## 2024-04-03 LAB — POCT GLYCOSYLATED HEMOGLOBIN (HGB A1C): Hemoglobin A1C: 6.7 % — AB (ref 4.0–5.6)

## 2024-04-03 MED ORDER — EMPAGLIFLOZIN 10 MG PO TABS
10.0000 mg | ORAL_TABLET | Freq: Every day | ORAL | 5 refills | Status: DC
Start: 2024-04-03 — End: 2024-10-09

## 2024-04-03 NOTE — Assessment & Plan Note (Signed)
 Point-of-care A1c checked today and was 6.7%, at goal, continue Jardiance  5 mg daily and diet and lifestyle efforts Follow-up in 50-months

## 2024-04-03 NOTE — Patient Instructions (Signed)
 Results for orders placed or performed in visit on 04/03/24  POCT HgB A1C   Collection Time: 04/03/24 11:04 AM  Result Value Ref Range   Hemoglobin A1C 6.7 (A) 4.0 - 5.6 %   HbA1c POC (<> result, manual entry)     HbA1c, POC (prediabetic range)     HbA1c, POC (controlled diabetic range)

## 2024-04-03 NOTE — Progress Notes (Signed)
 Name: Corey Norman   MRN: [id number]    DOB: [birth year]   Date:04/03/2024       Progress Note  Chief Complaint  Patient presents with   Medical Management of Chronic Issues   Patient presents for routine follow-up he is here with his wife and mandrin interpreter via virtual/video monitor was obtained today and used to complete the visit  Subjective:   Corey Norman is a 58 y.o. male, presents to clinic for routine follow up on chronic conditions  DM f/up On jardiance  and working on diet/lifestyle efforts (reports he's doing what he can, no major changes since last OV) Due for f/up A1c today and urine testing Lab Results  Component Value Date   HGBA1C 6.9 (H) 10/04/2023   HGBA1C 7.0 (H) 03/22/2023   HGBA1C 8.3 (A) 12/21/2022   HGBA1C 7.1 (H) 08/24/2022   HGBA1C 7.1 (H) 12/29/2021   On jardiance  5 mg daily, good compliance  Needs A1c and urine testing today UACR, everything else up to date    Current Outpatient Medications:    empagliflozin  (JARDIANCE ) 10 MG TABS tablet, Take 1 tablet (10 mg total) by mouth daily before breakfast., Disp: 30 tablet, Rfl: 5   rosuvastatin  (CRESTOR ) 5 MG tablet, Take 1 tablet (5 mg total) by mouth daily., Disp: 30 tablet, Rfl: 8  Patient Active Problem List   Diagnosis Date Noted   Medication nonadherence due to language barrier 01/25/2023   Requires document translation service to support health literacy 01/25/2023   Diabetes mellitus without complication (HCC) 02/10/2021   Hypercholesterolemia 06/07/2017   Vitamin D  insufficiency 06/07/2017    No past surgical history on file.  Family History  Problem Relation Age of Onset   Hypertension Mother     Social History   Tobacco Use   Smoking status: Never   Smokeless tobacco: Never  Substance Use Topics   Alcohol use: Yes    Alcohol/week: 0.0 standard drinks of alcohol    Comment: wine per night   Drug use: No     No Known Allergies  Health Maintenance  Topic Date Due   Colonoscopy   09/13/2022   COVID-19 Vaccine (4 - 2024-25 season) 07/24/2023   Diabetic kidney evaluation - Urine ACR  03/21/2024   HEMOGLOBIN A1C  04/02/2024   Pneumococcal Vaccine 43-75 Years old (1 of 2 - PCV) 01/01/2025 (Originally 08/28/1984)   Diabetic kidney evaluation - eGFR measurement  10/03/2024   FOOT EXAM  10/03/2024   OPHTHALMOLOGY EXAM  11/28/2024   Hepatitis C Screening  Completed   HIV Screening  Completed   HPV VACCINES  Aged Out   Meningococcal B Vaccine  Aged Out   DTaP/Tdap/Td  Discontinued   INFLUENZA VACCINE  Discontinued   Zoster Vaccines- Shingrix  Discontinued    Chart Review Today: I personally reviewed active problem list, medication list, allergies, family history, social history, health maintenance, notes from last encounter, lab results, imaging with the patient/caregiver today.   Review of Systems  All other systems reviewed and are negative.    Objective:   Vitals:   04/03/24 1032  BP: 124/68  Pulse: 67  Resp: 16  SpO2: 98%  Weight: 141 lb (64 kg)  Height: 5\' 5"  (1.651 m)    Body mass index is 23.46 kg/m.  Physical Exam Vitals and nursing note reviewed.  Constitutional:      General: He is not in acute distress.    Appearance: Normal appearance. He is well-developed and normal weight.  He is not ill-appearing, toxic-appearing or diaphoretic.     Interventions: Face mask in place.  HENT:     Head: Normocephalic and atraumatic.     Jaw: No trismus.     Right Ear: External ear normal.     Left Ear: External ear normal.  Eyes:     General: Lids are normal. No scleral icterus.       Right eye: No discharge.        Left eye: No discharge.     Conjunctiva/sclera: Conjunctivae normal.  Neck:     Trachea: Trachea and phonation normal. No tracheal deviation.  Cardiovascular:     Rate and Rhythm: Normal rate and regular rhythm.     Pulses: Normal pulses.          Radial pulses are 2+ on the right side and 2+ on the left side.       Posterior tibial  pulses are 2+ on the right side and 2+ on the left side.     Heart sounds: Normal heart sounds. No murmur heard.    No friction rub. No gallop.  Pulmonary:     Effort: Pulmonary effort is normal. No respiratory distress.     Breath sounds: Normal breath sounds. No stridor. No wheezing, rhonchi or rales.  Abdominal:     General: Bowel sounds are normal. There is no distension.     Palpations: Abdomen is soft.  Musculoskeletal:     Right lower leg: No edema.     Left lower leg: No edema.  Skin:    General: Skin is warm and dry.     Coloration: Skin is not jaundiced.     Findings: No rash.     Nails: There is no clubbing.  Neurological:     Mental Status: He is alert. Mental status is at baseline.     Cranial Nerves: No dysarthria or facial asymmetry.     Motor: No tremor or abnormal muscle tone.     Gait: Gait normal.  Psychiatric:        Mood and Affect: Mood normal.        Speech: Speech normal.        Behavior: Behavior normal. Behavior is cooperative.      Functional Status Survey:   Results for orders placed or performed in visit on 01/04/24  HM DIABETES EYE EXAM   Collection Time: 11/29/23  8:03 AM  Result Value Ref Range   HM Diabetic Eye Exam No Retinopathy No Retinopathy      Assessment & Plan:   Diabetes mellitus without complication (HCC) Assessment & Plan: Point-of-care A1c checked today and was 6.7%, at goal, continue Jardiance  5 mg daily and diet and lifestyle efforts Follow-up in 24-months  Orders: -     Microalbumin / creatinine urine ratio -     POCT glycosylated hemoglobin (Hb A1C) -     Empagliflozin ; Take 1 tablet (10 mg total) by mouth daily before breakfast.  Dispense: 30 tablet; Refill: 5  Colon cancer screening -     Ambulatory referral to Gastroenterology   Results for orders placed or performed in visit on 01/04/24  HM DIABETES EYE EXAM   Collection Time: 11/29/23  8:03 AM  Result Value Ref Range   HM Diabetic Eye Exam No Retinopathy  No Retinopathy      Return in about 6 months (around 10/04/2024) for Routine follow-up.   Adeline Hone, PA-C 04/03/24 10:51 AM

## 2024-04-04 ENCOUNTER — Ambulatory Visit: Payer: Self-pay | Admitting: Family Medicine

## 2024-04-04 LAB — MICROALBUMIN / CREATININE URINE RATIO
Creatinine, Urine: 115 mg/dL (ref 20–320)
Microalb Creat Ratio: 4 mg/g{creat} (ref <30)
Microalb, Ur: 0.5 mg/dL

## 2024-04-30 DIAGNOSIS — Z8249 Family history of ischemic heart disease and other diseases of the circulatory system: Secondary | ICD-10-CM | POA: Diagnosis not present

## 2024-04-30 DIAGNOSIS — I10 Essential (primary) hypertension: Secondary | ICD-10-CM | POA: Diagnosis not present

## 2024-04-30 DIAGNOSIS — E785 Hyperlipidemia, unspecified: Secondary | ICD-10-CM | POA: Diagnosis not present

## 2024-04-30 DIAGNOSIS — Z7984 Long term (current) use of oral hypoglycemic drugs: Secondary | ICD-10-CM | POA: Diagnosis not present

## 2024-04-30 DIAGNOSIS — E119 Type 2 diabetes mellitus without complications: Secondary | ICD-10-CM | POA: Diagnosis not present

## 2024-04-30 DIAGNOSIS — Z008 Encounter for other general examination: Secondary | ICD-10-CM | POA: Diagnosis not present

## 2024-06-18 ENCOUNTER — Ambulatory Visit: Payer: Self-pay

## 2024-06-18 NOTE — Telephone Encounter (Signed)
 FYI Only or Action Required?: FYI only for provider.  Patient was last seen in primary care on 12/21/2022 by Corey Fend, DO.  Called Nurse Triage reporting Hematuria.  Symptoms began several days ago.  Interventions attempted: Nothing.  Symptoms are: gradually worsening.  Triage Disposition: See Physician Within 24 Hours  Patient/caregiver understands and will follow disposition?: Yes  Copied from CRM #[id number]. Topic: Clinical - Red Word Triage >> Jun 18, 2024 11:27 AM Turkey B wrote: [facility] that prompted transfer to Nurse Triage: pt's daughter called in, pt has blood in his urine Reason for Disposition  Blood in urine  (Exception: Could be normal menstrual bleeding.)  Answer Assessment - Initial Assessment Questions 1. COLOR of URINE: Describe the color of the urine.  (e.g., tea-colored, pink, red, bloody) Do you have blood clots in your urine? (e.g., none, pea, grape, small coin)     Pink-tinged per daughter  2. ONSET: When did the bleeding start?      A few days  3. EPISODES: How many times has there been blood in the urine? or How many times today?     Has noticed urination each time  4. PAIN with URINATION: Is there any pain with passing your urine? If Yes, ask: How bad is the pain?  (Scale 1-10; or mild, moderate, severe)     No pain  5. FEVER: Do you have a fever? If Yes, ask: What is your temperature, how was it measured, and when did it start?     No fever  6. ASSOCIATED SYMPTOMS: Are you passing urine more frequently than usual?     Unsure  7. OTHER SYMPTOMS: Do you have any other symptoms? (e.g., back/flank pain, abdomen pain, vomiting)     No  Protocols used: Urine - Blood In-A-AH

## 2024-06-19 ENCOUNTER — Ambulatory Visit (INDEPENDENT_AMBULATORY_CARE_PROVIDER_SITE_OTHER): Admitting: Internal Medicine

## 2024-06-19 ENCOUNTER — Encounter: Payer: Self-pay | Admitting: Internal Medicine

## 2024-06-19 VITALS — BP 118/72 | HR 60 | Temp 97.8°F | Resp 16 | Ht 65.0 in | Wt 139.2 lb

## 2024-06-19 DIAGNOSIS — K649 Unspecified hemorrhoids: Secondary | ICD-10-CM | POA: Diagnosis not present

## 2024-06-19 DIAGNOSIS — R311 Benign essential microscopic hematuria: Secondary | ICD-10-CM

## 2024-06-19 MED ORDER — HYDROCORTISONE 1 % EX CREA: 1.0000 | TOPICAL_CREAM | Freq: Two times a day (BID) | CUTANEOUS | 1 refills | Status: DC

## 2024-06-19 MED ORDER — HYDROCORTISONE (PERIANAL) 1 % EX CREA
1.0000 | TOPICAL_CREAM | Freq: Every day | CUTANEOUS | 1 refills | Status: AC
Start: 2024-06-19 — End: ?

## 2024-06-19 NOTE — Progress Notes (Signed)
   Acute Office Visit  Subjective:     Patient ID: Corey Norman, male    DOB: 27-Jun-1966, 58 y.o.   MRN: [id number]  Chief Complaint  Patient presents with   Blood In Stools    HPI Patient is in today for blood in stools. He is here with his wife. Translation services used to aid in communication.   Discussed the use of AI scribe software for clinical note transcription with the patient, who gave verbal consent to proceed.  History of Present Illness Corey Norman is a 58 year old male who presents with blood in his stools for the past ten days.  He experiences bright red blood on toilet paper after wiping, without pain during bowel movements. He typically has one bowel movement per day. This episode of bleeding is longer than previous ones, which resolved in three to four days. He has not undergone colon cancer screening and has no history of hemorrhoids. Constipation and diarrhea are rare, and he sometimes sits on the toilet for extended periods.   Review of Systems  Constitutional:  Negative for chills and fever.  Gastrointestinal:  Positive for blood in stool and constipation. Negative for abdominal pain, diarrhea and melena.  Genitourinary: Negative.         Objective:    BP 118/72 (Cuff Size: Normal)   Pulse 60   Temp 97.8 F (36.6 C) (Oral)   Resp 16   Ht 5' 5 (1.651 m)   Wt 139 lb 3.2 oz (63.1 kg)   SpO2 98%   BMI 23.16 kg/m    Physical Exam Exam conducted with a chaperone present.  Constitutional:      Appearance: Normal appearance.  HENT:     Head: Normocephalic and atraumatic.  Eyes:     Conjunctiva/sclera: Conjunctivae normal.  Cardiovascular:     Rate and Rhythm: Normal rate and regular rhythm.  Pulmonary:     Effort: Pulmonary effort is normal.     Breath sounds: Normal breath sounds.  Abdominal:     Comments: Rectal exam showing 3 large external hemorrhoids, non-bleeding  Skin:    General: Skin is warm and dry.  Neurological:     General: No focal  deficit present.     Mental Status: He is alert. Mental status is at baseline.  Psychiatric:        Mood and Affect: Mood normal.        Behavior: Behavior normal.     No results found for any visits on 06/19/24.      Assessment & Plan:   Assessment & Plan External hemorrhoids with rectal bleeding Bright red blood on wiping for ten days. Three large external hemorrhoids likely exacerbated by straining. Discussed differential diagnosis for rectal bleeding. - Prescribed steroid cream for daily application. - Advised to monitor for changes in bleeding pattern and report if he occurs. - Instructed to discontinue cream if bleeding stops after a few days and retain for future use. - Advised to report if bleeding persists after two weeks of cream application.  Constipation Infrequent constipation may exacerbate hemorrhoids. Hard stool despite daily bowel movements leads to straining. - Advised to increase water intake and dietary fiber. - Recommended fiber supplements like Metamucil or Benefiber if dietary changes are insufficient.  - Hydrocortisone , Perianal, (PREPARATION H) 1 % CREA; Apply 1 Application topically daily.  Dispense: 26 g; Refill: 1  Return if symptoms worsen or fail to improve.  Sharyle Fischer, DO

## 2024-08-21 ENCOUNTER — Telehealth: Payer: Self-pay | Admitting: Pharmacy Technician

## 2024-08-21 ENCOUNTER — Other Ambulatory Visit (HOSPITAL_COMMUNITY): Payer: Self-pay

## 2024-08-21 DIAGNOSIS — L508 Other urticaria: Secondary | ICD-10-CM | POA: Diagnosis not present

## 2024-08-21 DIAGNOSIS — R3121 Asymptomatic microscopic hematuria: Secondary | ICD-10-CM | POA: Diagnosis not present

## 2024-08-21 NOTE — Telephone Encounter (Signed)
 Pharmacy Patient Advocate Encounter  Received notification from [facility] that Prior Authorization for Jardiance  10MG  tablets has been APPROVED from 08/21/24 to 08/21/25   PA #/Case ID/Reference #: [id number]

## 2024-08-21 NOTE — Telephone Encounter (Signed)
 Pharmacy Patient Advocate Encounter   Received notification from CoverMyMeds that prior authorization for Jardiance  10MG  tablets  is due for renewal.   Insurance verification completed.   The patient is insured through [facility].  Action: PA required; PA started via CoverMyMeds. KEY [id number] . Waiting for clinical questions to populate.

## 2024-09-25 ENCOUNTER — Ambulatory Visit: Admitting: Family Medicine

## 2024-10-03 DIAGNOSIS — L508 Other urticaria: Secondary | ICD-10-CM | POA: Diagnosis not present

## 2024-10-09 ENCOUNTER — Encounter: Payer: Self-pay | Admitting: Nurse Practitioner

## 2024-10-09 ENCOUNTER — Ambulatory Visit: Admitting: Nurse Practitioner

## 2024-10-09 VITALS — BP 124/86 | HR 63 | Ht 65.0 in | Wt 137.0 lb

## 2024-10-09 DIAGNOSIS — E119 Type 2 diabetes mellitus without complications: Secondary | ICD-10-CM

## 2024-10-09 DIAGNOSIS — Z1211 Encounter for screening for malignant neoplasm of colon: Secondary | ICD-10-CM

## 2024-10-09 DIAGNOSIS — Z130 Encounter for screening for diseases of the blood and blood-forming organs and certain disorders involving the immune mechanism: Secondary | ICD-10-CM | POA: Diagnosis not present

## 2024-10-09 DIAGNOSIS — Z7984 Long term (current) use of oral hypoglycemic drugs: Secondary | ICD-10-CM | POA: Diagnosis not present

## 2024-10-09 DIAGNOSIS — E7800 Pure hypercholesterolemia, unspecified: Secondary | ICD-10-CM | POA: Diagnosis not present

## 2024-10-09 MED ORDER — EMPAGLIFLOZIN 10 MG PO TABS: 10.0000 mg | ORAL_TABLET | Freq: Every day | ORAL | 5 refills | Status: AC

## 2024-10-09 MED ORDER — ROSUVASTATIN CALCIUM 5 MG PO TABS: 5.0000 mg | ORAL_TABLET | Freq: Every day | ORAL | 8 refills | Status: AC

## 2024-10-09 NOTE — Progress Notes (Signed)
 BP 124/86   Pulse 63   Ht 5' 5 (1.651 m)   Wt 137 lb (62.1 kg)   SpO2 99%   BMI 22.80 kg/m    Subjective:    Patient ID: Corey Norman, male    DOB: 1966-10-16, 58 y.o.   MRN: [id number]  HPI: DALTON MOLESWORTH is a 58 y.o. male  Chief Complaint  Patient presents with   Medical Management of Chronic Issues    Pt takes last dose of prednisone for rash today.    Discussed the use of AI scribe software for clinical note transcription with the patient, who gave verbal consent to proceed.  History of Present Illness Corey Norman is a 58 year old male who presents for a routine follow-up with interpreter.   Diabetes mellitus - Managed with Jardiance  10 mg daily - Last hemoglobin A1c in May was 6.7 - Regularly monitors blood glucose levels at home  Hyperlipidemia - Takes rosuvastatin  5 mg daily - Lipid panel in November 2024 was within normal range Lipid Panel     Component Value Date/Time   CHOL 158 10/04/2023 1129   CHOL 181 12/06/2017 1146   TRIG 101 10/04/2023 1129   HDL 80 10/04/2023 1129   HDL 72 12/06/2017 1146   CHOLHDL 2.0 10/04/2023 1129   VLDL 33 (H) 06/07/2017 1017   LDLCALC 60 10/04/2023 1129   LABVLDL 19 12/06/2017 1146     Cutaneous rash - Recent rash treated at urgent care with prednisone - Taking last dose of prednisone today - Rash has resolved  Allergic reaction - Recent emergency visit for allergic reaction of unknown cause - Interested in identifying the allergen to prevent future occurrences  Colorectal cancer screening - Overdue for colon cancer screening         10/09/2024    8:22 AM 10/04/2023   10:43 AM 06/28/2023   10:22 AM  Depression screen PHQ 2/9  Decreased Interest 0 0 0  Down, Depressed, Hopeless 0 0 0  PHQ - 2 Score 0 0 0  Altered sleeping 0 0 0  Tired, decreased energy 0 0 0  Change in appetite 0 0 0  Feeling bad or failure about yourself  0 0 0  Trouble concentrating 0 0 0  Moving slowly or fidgety/restless 0 0 0  Suicidal  thoughts 0 0 0  PHQ-9 Score 0 0  0   Difficult doing work/chores Not difficult at all Not difficult at all Not difficult at all     Data saved with a previous flowsheet row definition    Relevant past medical, surgical, family and social history reviewed and updated as indicated. Interim medical history since our last visit reviewed. Allergies and medications reviewed and updated.  Review of Systems  Constitutional: Negative for fever or weight change.  Respiratory: Negative for cough and shortness of breath.   Cardiovascular: Negative for chest pain or palpitations.  Gastrointestinal: Negative for abdominal pain, no bowel changes.  Musculoskeletal: Negative for gait problem or joint swelling.  Skin: Negative for rash.  Neurological: Negative for dizziness or headache.  No other specific complaints in a complete review of systems (except as listed in HPI above).      Objective:      BP 124/86   Pulse 63   Ht 5' 5 (1.651 m)   Wt 137 lb (62.1 kg)   SpO2 99%   BMI 22.80 kg/m    Wt Readings from Last 3 Encounters:  10/09/24 137  lb (62.1 kg)  06/19/24 139 lb 3.2 oz (63.1 kg)  04/03/24 141 lb (64 kg)    Physical Exam GENERAL: Alert, cooperative, well developed, no acute distress HEENT: Normocephalic, normal oropharynx, moist mucous membranes CHEST: Clear to auscultation bilaterally, no wheezes, rhonchi, or crackles CARDIOVASCULAR: Normal heart rate and rhythm, S1 and S2 normal without murmurs ABDOMEN: Soft, non-tender, non-distended, without organomegaly, normal bowel sounds EXTREMITIES: No cyanosis or edema NEUROLOGICAL: Cranial nerves grossly intact, moves all extremities without gross motor or sensory deficit  Diabetic Foot Exam - Simple   Simple Foot Form Visual Inspection No deformities, no ulcerations, no other skin breakdown bilaterally: Yes Sensation Testing Intact to touch and monofilament testing bilaterally: Yes Pulse Check Posterior Tibialis and Dorsalis  pulse intact bilaterally: Yes Comments     Results for orders placed or performed in visit on 04/03/24  POCT HgB A1C   Collection Time: 04/03/24 11:04 AM  Result Value Ref Range   Hemoglobin A1C 6.7 (A) 4.0 - 5.6 %   HbA1c POC (<> result, manual entry)     HbA1c, POC (prediabetic range)     HbA1c, POC (controlled diabetic range)    Urine Microalbumin w/creat. ratio   Collection Time: 04/03/24 12:16 PM  Result Value Ref Range   Creatinine, Urine 115 20 - 320 mg/dL   Microalb, Ur 0.5 mg/dL   Microalb Creat Ratio 4 <30 mg/g creat          Assessment & Plan:   Problem List Items Addressed This Visit       Endocrine   Diabetes mellitus without complication (HCC) - Primary   Relevant Medications   empagliflozin  (JARDIANCE ) 10 MG TABS tablet   rosuvastatin  (CRESTOR ) 5 MG tablet   Other Relevant Orders   Comprehensive metabolic panel with GFR   Microalbumin / creatinine urine ratio   Hemoglobin A1c   HM Diabetes Foot Exam (Completed)     Other   Hypercholesterolemia   Relevant Medications   rosuvastatin  (CRESTOR ) 5 MG tablet   Other Relevant Orders   Comprehensive metabolic panel with GFR   Lipid panel   Other Visit Diagnoses       Screening for deficiency anemia       Relevant Orders   CBC with Differential/Platelet     Screening for colon cancer       Relevant Orders   Ambulatory referral to Gastroenterology        Assessment and Plan Assessment & Plan Type 2 diabetes mellitus Well-controlled with an A1c of 6.7% as of May. - Continue Jardiance  10 mg daily - Ordered labs for diabetes monitoring -due for foot exam, and urine microalbumin urine  Pure hypercholesterolemia Well-managed with a normal lipid panel as of November 2024. - Continue rosuvastatin  5 mg daily  Resolved allergic reaction Recent allergic reaction treated with prednisone, currently resolving. - Referred to allergist for allergy testing  General Health Maintenance Overdue for colon  cancer screening. - Recommended colon cancer screening        Follow up plan: Return in about 6 months (around 04/08/2025) for follow up.

## 2024-10-10 ENCOUNTER — Ambulatory Visit: Payer: Self-pay | Admitting: Nurse Practitioner

## 2024-10-10 LAB — COMPREHENSIVE METABOLIC PANEL WITH GFR
AG Ratio: 2 (calc) (ref 1.0–2.5)
ALT: 18 U/L (ref 9–46)
AST: 13 U/L (ref 10–35)
Albumin: 4.8 g/dL (ref 3.6–5.1)
Alkaline phosphatase (APISO): 54 U/L (ref 35–144)
BUN: 18 mg/dL (ref 7–25)
CO2: 24 mmol/L (ref 20–32)
Calcium: 9.5 mg/dL (ref 8.6–10.3)
Chloride: 106 mmol/L (ref 98–110)
Creat: 0.76 mg/dL (ref 0.70–1.30)
Globulin: 2.4 g/dL (ref 1.9–3.7)
Glucose, Bld: 128 mg/dL — ABNORMAL HIGH (ref 65–99)
Potassium: 4.4 mmol/L (ref 3.5–5.3)
Sodium: 140 mmol/L (ref 135–146)
Total Bilirubin: 0.9 mg/dL (ref 0.2–1.2)
Total Protein: 7.2 g/dL (ref 6.1–8.1)
eGFR: 104 mL/min/1.73m2 (ref >=60)

## 2024-10-10 LAB — LIPID PANEL
Cholesterol: 144 mg/dL (ref <200)
HDL: 82 mg/dL (ref >=40)
LDL Cholesterol (Calc): 46 mg/dL
Non-HDL Cholesterol (Calc): 62 mg/dL (ref <130)
Total CHOL/HDL Ratio: 1.8 (calc) (ref <5.0)
Triglycerides: 82 mg/dL (ref <150)

## 2024-10-10 LAB — CBC WITH DIFFERENTIAL/PLATELET
Absolute Lymphocytes: 1661 {cells}/uL (ref 850–3900)
Absolute Monocytes: 346 {cells}/uL (ref 200–950)
Basophils Absolute: 29 {cells}/uL (ref 0–200)
Basophils Relative: 0.6 %
Eosinophils Absolute: 19 {cells}/uL (ref 15–500)
Eosinophils Relative: 0.4 %
HCT: 48.3 % (ref 38.5–50.0)
Hemoglobin: 16.1 g/dL (ref 13.2–17.1)
MCH: 31.4 pg (ref 27.0–33.0)
MCHC: 33.3 g/dL (ref 32.0–36.0)
MCV: 94.2 fL (ref 80.0–100.0)
MPV: 9.9 fL (ref 7.5–12.5)
Monocytes Relative: 7.2 %
Neutro Abs: 2746 {cells}/uL (ref 1500–7800)
Neutrophils Relative %: 57.2 %
Platelets: 267 Thousand/uL (ref 140–400)
RBC: 5.13 Million/uL (ref 4.20–5.80)
RDW: 12.5 % (ref 11.0–15.0)
Total Lymphocyte: 34.6 %
WBC: 4.8 Thousand/uL (ref 3.8–10.8)

## 2024-10-10 LAB — HEMOGLOBIN A1C
Hgb A1c MFr Bld: 7 % — ABNORMAL HIGH (ref <5.7)
Mean Plasma Glucose: 154 mg/dL
eAG (mmol/L): 8.5 mmol/L

## 2024-10-10 LAB — MICROALBUMIN / CREATININE URINE RATIO
Creatinine, Urine: 170 mg/dL (ref 20–320)
Microalb Creat Ratio: 6 mg/g{creat} (ref <30)
Microalb, Ur: 1 mg/dL

## 2024-10-11 ENCOUNTER — Telehealth: Payer: Self-pay

## 2024-10-11 NOTE — Telephone Encounter (Signed)
 Pt daughter returned your call to schedule procedure for pt

## 2024-10-11 NOTE — Telephone Encounter (Signed)
 Copied from CRM #[id number]. Topic: Clinical - Lab/Test Results >> Oct 11, 2024  3:04 PM Corey Norman wrote: Reason for CRM: Pt daughter, Corey Norman, returning call regarding pt's lab results. Relayed message verbatim. Corey Norman said pt is taking his Jardiance  and rosuvastatin  daily.

## 2024-10-15 ENCOUNTER — Telehealth: Payer: Self-pay

## 2024-10-15 ENCOUNTER — Other Ambulatory Visit: Payer: Self-pay

## 2024-10-15 DIAGNOSIS — R21 Rash and other nonspecific skin eruption: Secondary | ICD-10-CM | POA: Diagnosis not present

## 2024-10-15 DIAGNOSIS — J301 Allergic rhinitis due to pollen: Secondary | ICD-10-CM | POA: Diagnosis not present

## 2024-10-15 DIAGNOSIS — Z1211 Encounter for screening for malignant neoplasm of colon: Secondary | ICD-10-CM

## 2024-10-15 DIAGNOSIS — J3089 Other allergic rhinitis: Secondary | ICD-10-CM | POA: Diagnosis not present

## 2024-10-15 MED ORDER — NA SULFATE-K SULFATE-MG SULF 17.5-3.13-1.6 GM/177ML PO SOLN: 1.0000 | Freq: Once | ORAL | 0 refills | Status: AC

## 2024-10-15 NOTE — Telephone Encounter (Signed)
 Gastroenterology Pre-Procedure Review  Request Date: 11/19/25 Requesting Physician: Dr. Melany  PATIENT REVIEW QUESTIONS: The patient's daughter Sari responded to the following health history questions as indicated:    1. Are you having any GI issues? no 2. Do you have a personal history of Polyps? no 3. Do you have a family history of Colon Cancer or Polyps? no 4. Diabetes Mellitus? Yes takes metformin  has been advised to stop 3 days prior to colonoscopy 5. Joint replacements in the past 12 months?no 6. Major health problems in the past 3 months?no 7. Any artificial heart valves, MVP, or defibrillator?no    MEDICATIONS & ALLERGIES:    Patient reports the following regarding taking any anticoagulation/antiplatelet therapy:   Plavix, Coumadin, Eliquis, Xarelto, Lovenox, Pradaxa, Brilinta, or Effient? no Aspirin? no  Patient confirms/reports the following medications:  Current Outpatient Medications  Medication Sig Dispense Refill   Na Sulfate-K Sulfate-Mg Sulfate concentrate (SUPREP) 17.5-3.13-1.6 GM/177ML SOLN Take 1 kit (354 mLs total) by mouth once for 1 dose. 354 mL 0   Cetirizine HCl 10 MG CAPS Take 10 mg in the morning, and 10 at night.  May titrate up to 2 in the morning and 1 at night in one week if symptoms do not improve, then 2 in the morning and 2 at night the next week if symptoms do not improve.     empagliflozin  (JARDIANCE ) 10 MG TABS tablet Take 1 tablet (10 mg total) by mouth daily before breakfast. 30 tablet 5   Hydrocortisone , Perianal, (PREPARATION H) 1 % CREA Apply 1 Application topically daily. 26 g 1   rosuvastatin  (CRESTOR ) 5 MG tablet Take 1 tablet (5 mg total) by mouth daily. 30 tablet 8   No current facility-administered medications for this visit.    Patient confirms/reports the following allergies:  No Known Allergies  No orders of the defined types were placed in this encounter.   AUTHORIZATION INFORMATION Primary Insurance: 1D#: Group  #:  Secondary Insurance: 1D#: Group #:  SCHEDULE INFORMATION: Date: 11/19/25 Time: Location: [facility]

## 2024-10-17 ENCOUNTER — Telehealth: Payer: Self-pay

## 2024-10-17 NOTE — Telephone Encounter (Signed)
 Pt 's daughter has been contacted to reschedule her colonoscopy due to over scheduling.  Procedure has been rescheduled to 12/10/24.    Instructions updated.  Referral updated.  Thanks,  [place], CMA

## 2024-12-10 ENCOUNTER — Ambulatory Visit: Admit: 2024-12-10 | Admitting: Gastroenterology

## 2024-12-10 SURGERY — COLONOSCOPY
Anesthesia: Choice

## 2025-04-09 ENCOUNTER — Ambulatory Visit: Admitting: Nurse Practitioner
